# Patient Record
Sex: Male | Born: 1970
Health system: Southern US, Community
[De-identification: ages and names within clinical notes are randomized; demographics above are authoritative.]

## PROBLEM LIST (undated history)

## (undated) DIAGNOSIS — I10 Essential (primary) hypertension: Secondary | ICD-10-CM

## (undated) DIAGNOSIS — K649 Unspecified hemorrhoids: Secondary | ICD-10-CM

## (undated) DIAGNOSIS — T7840XA Allergy, unspecified, initial encounter: Secondary | ICD-10-CM

## (undated) DIAGNOSIS — R040 Epistaxis: Secondary | ICD-10-CM

## (undated) HISTORY — DX: Unspecified hemorrhoids: K64.9

## (undated) HISTORY — DX: Essential (primary) hypertension: I10

## (undated) HISTORY — DX: Allergy, unspecified, initial encounter: T78.40XA

## (undated) HISTORY — DX: Epistaxis: R04.0

---

## 2007-07-14 HISTORY — PX: HERNIA REPAIR: SHX51

## 2013-06-16 ENCOUNTER — Ambulatory Visit (INDEPENDENT_AMBULATORY_CARE_PROVIDER_SITE_OTHER): Payer: BC Managed Care – PPO | Admitting: Family Medicine

## 2013-06-16 VITALS — BP 120/76 | HR 75 | Temp 98.3°F | Resp 16 | Ht 67.5 in | Wt 166.0 lb

## 2013-06-16 DIAGNOSIS — K4090 Unilateral inguinal hernia, without obstruction or gangrene, not specified as recurrent: Secondary | ICD-10-CM

## 2013-06-16 DIAGNOSIS — K644 Residual hemorrhoidal skin tags: Secondary | ICD-10-CM

## 2013-06-16 DIAGNOSIS — K648 Other hemorrhoids: Secondary | ICD-10-CM

## 2013-06-16 DIAGNOSIS — H1045 Other chronic allergic conjunctivitis: Secondary | ICD-10-CM

## 2013-06-16 DIAGNOSIS — R040 Epistaxis: Secondary | ICD-10-CM

## 2013-06-16 DIAGNOSIS — Z0000 Encounter for general adult medical examination without abnormal findings: Secondary | ICD-10-CM

## 2013-06-16 DIAGNOSIS — H1013 Acute atopic conjunctivitis, bilateral: Secondary | ICD-10-CM

## 2013-06-16 LAB — POCT CBC
HCT, POC: 52.5 % (ref 43.5–53.7)
Hemoglobin: 16.9 g/dL (ref 14.1–18.1)
Lymph, poc: 1.7 (ref 0.6–3.4)
MCH, POC: 27 pg (ref 27–31.2)
MCV: 83.8 fL (ref 80–97)
MPV: 13.1 fL (ref 0–99.8)
POC MID %: 8.1 %M (ref 0–12)
Platelet Count, POC: 159 10*3/uL (ref 142–424)
RBC: 6.27 M/uL — AB (ref 4.69–6.13)
WBC: 6.1 10*3/uL (ref 4.6–10.2)

## 2013-06-16 LAB — COMPREHENSIVE METABOLIC PANEL
AST: 19 U/L (ref 0–37)
Albumin: 4.2 g/dL (ref 3.5–5.2)
Alkaline Phosphatase: 109 U/L (ref 39–117)
Chloride: 105 mEq/L (ref 96–112)
Glucose, Bld: 94 mg/dL (ref 70–99)
Potassium: 4 mEq/L (ref 3.5–5.3)
Sodium: 137 mEq/L (ref 135–145)
Total Protein: 7.4 g/dL (ref 6.0–8.3)

## 2013-06-16 LAB — POCT GLYCOSYLATED HEMOGLOBIN (HGB A1C): Hemoglobin A1C: 5.3

## 2013-06-16 LAB — LIPID PANEL
LDL Cholesterol: 113 mg/dL — ABNORMAL HIGH (ref 0–99)
Total CHOL/HDL Ratio: 5.4 Ratio
Triglycerides: 108 mg/dL (ref <150)

## 2013-06-16 MED ORDER — HYDROCORTISONE ACE-PRAMOXINE 2.5-1 % RE CREA: 1.0000 "application " | TOPICAL_CREAM | Freq: Three times a day (TID) | RECTAL | Status: DC

## 2013-06-16 NOTE — Progress Notes (Signed)
Physical examination:  History: Patient is here for his annual physical examination. He is generally in good health. He is from the [place] in [place]. He generally is healthy though he has some problems with snoring, hemorrhoids, nosebleeds.  Past history: Medications: None Allergies: None Medical illnesses: None Surgical: Left inguinal hernia 11/24/07  Family history: Father died of heart disease. Mother is living and well. Brother has diabetes. Sister living and well.  Social history: Patient uses snuff. Does not use alcohol or drugs. He is married, his wife is in [place], he has one child. He has gotten his [place] citizenship and hopes to bring his wife over here soon. He works as a Naval architect person in Engineer, water type role. He does not have to do a lot of manual work. He has college education.  Review of systems: Constitutional: Unremarkable HEENT: Unremarkable except for epistaxis from the left nares usually in the winter. Also he has summer time allergies Respiratory: Unremarkable Cardiovascular: Unremarkable Gastrointestinal: Has had hemorrhoids. They have bled in the past. There still problem. Endocrine: Normal colon Genitourinary: Unremarkable A skeletal: Unremarkable Allergies: Has seasonal allergies Neurologic: Unremarkable Hematologic: Psychiatric: Unremarkable  Physical examination: Well-developed well-nourished man in no acute distress. TMs normal. Eyes PERRLA. Fundi benign. Throat clear. He has very prominent vessels on the septum on the left side of his nose, which is where he bleeds from I'm sure. His throat is clear. Neck supple without nodes or thyromegaly. Chest clear to auscultation. Heart regular without murmurs gallops or arrhythmias. Has a prominent S2. Abdomen soft without mass or tenderness. Normal male external genitalia. Has a herniorrhaphy scar of the left. He does have a palpable right inguinal hernia. It is not bulging but definitely bulges with coughing. His  extremities are unremarkable. Skin unremarkable.  Assessment: Complete physical Hemorrhoids Epistaxis Allergic conjunctivitis Right inguinal hernia  Plan: Check labs Return when necessary  Results for orders placed in visit on 06/16/13  POCT CBC      Result Value Range   WBC 6.1  4.6 - 10.2 K/uL   Lymph, poc 1.7  0.6 - 3.4   POC LYMPH PERCENT 28.2  10 - 50 %L   MID (cbc) 0.5  0 - 0.9   POC MID % 8.1  0 - 12 %M   POC Granulocyte 3.9  2 - 6.9   Granulocyte percent 63.7  37 - 80 %G   RBC 6.27 (*) 4.69 - 6.13 M/uL   Hemoglobin 16.9  14.1 - 18.1 g/dL   HCT, POC 21.3  08.6 - 53.7 %   MCV 83.8  80 - 97 fL   MCH, POC 27.0  27 - 31.2 pg   MCHC 32.2  31.8 - 35.4 g/dL   RDW, POC 57.8     Platelet Count, POC 159  142 - 424 K/uL   MPV 13.1  0 - 99.8 fL  IFOBT (OCCULT BLOOD)      Result Value Range   IFOBT Negative    POCT GLYCOSYLATED HEMOGLOBIN (HGB A1C)      Result Value Range   Hemoglobin A1C 5.3

## 2013-06-16 NOTE — Patient Instructions (Addendum)
Take claritin (loratidine) or allegra (fexofenadine) one daily when you have problems with eye itching.  If that does not work try some zyrtec eye drops.  Use Analpram cream twice daily for hemorrhoids  Use a little Polysporin (non-prescription) ointment in your nose several times a day when you have problems with nosebleeds.  If your nose is bleeding, squeeze it tightly like we talked about for about 5 minutes.  We will contact you about the results of your labs  After you get back from [place], you might consider contacting me to make a referral for you to see a surgeon for the other hernia.  Return if further problems

## 2013-06-17 ENCOUNTER — Encounter: Payer: Self-pay | Admitting: Family Medicine

## 2014-09-30 ENCOUNTER — Ambulatory Visit (INDEPENDENT_AMBULATORY_CARE_PROVIDER_SITE_OTHER): Payer: BLUE CROSS/BLUE SHIELD | Admitting: Family Medicine

## 2014-09-30 VITALS — BP 160/100 | HR 104 | Temp 100.5°F | Resp 16 | Ht 69.5 in | Wt 168.0 lb

## 2014-09-30 DIAGNOSIS — R509 Fever, unspecified: Secondary | ICD-10-CM | POA: Diagnosis not present

## 2014-09-30 DIAGNOSIS — R6889 Other general symptoms and signs: Secondary | ICD-10-CM

## 2014-09-30 DIAGNOSIS — R1084 Generalized abdominal pain: Secondary | ICD-10-CM

## 2014-09-30 DIAGNOSIS — J101 Influenza due to other identified influenza virus with other respiratory manifestations: Secondary | ICD-10-CM | POA: Diagnosis not present

## 2014-09-30 LAB — COMPLETE METABOLIC PANEL WITH GFR
ALT: 17 U/L (ref 0–53)
AST: 27 U/L (ref 0–37)
Albumin: 3.7 g/dL (ref 3.5–5.2)
Alkaline Phosphatase: 99 U/L (ref 39–117)
CO2: 23 mEq/L (ref 19–32)
Calcium: 8.7 mg/dL (ref 8.4–10.5)
Chloride: 101 mEq/L (ref 96–112)
Creat: 0.92 mg/dL (ref 0.50–1.35)
GFR, Est African American: >89 mL/min
GFR, Est Non African American: >89 mL/min
Potassium: 3.7 mEq/L (ref 3.5–5.3)
Sodium: 134 mEq/L — ABNORMAL LOW (ref 135–145)
Total Bilirubin: 0.8 mg/dL (ref 0.2–1.2)

## 2014-09-30 LAB — COMPLETE METABOLIC PANEL WITHOUT GFR
BUN: 12 mg/dL (ref 6–23)
Glucose, Bld: 192 mg/dL — ABNORMAL HIGH (ref 70–99)
Total Protein: 6.9 g/dL (ref 6.0–8.3)

## 2014-09-30 LAB — POCT CBC
Granulocyte percent: 78.4 %G (ref 37–80)
HCT, POC: 51.6 % (ref 43.5–53.7)
Hemoglobin: 16.8 g/dL (ref 14.1–18.1)
Lymph, poc: 1.7 (ref 0.6–3.4)
MCH, POC: 25.3 pg — AB (ref 27–31.2)
MCHC: 32.6 g/dL (ref 31.8–35.4)
MCV: 77.7 fL — AB (ref 80–97)
MID (cbc): 0.3 (ref 0–0.9)
MPV: 8.7 fL (ref 0–99.8)
POC Granulocyte: 7 — AB (ref 2–6.9)
POC LYMPH PERCENT: 18.7 %L (ref 10–50)
POC MID %: 2.9 % (ref 0–12)
Platelet Count, POC: 119 10*3/uL — AB (ref 142–424)
RBC: 6.64 M/uL — AB (ref 4.69–6.13)
RDW, POC: 14.1 %
WBC: 8.9 10*3/uL (ref 4.6–10.2)

## 2014-09-30 LAB — POCT INFLUENZA A/B
Influenza A, POC: POSITIVE
Influenza B, POC: NEGATIVE

## 2014-09-30 MED ORDER — IBUPROFEN 200 MG PO TABS
600.0000 mg | ORAL_TABLET | Freq: Once | ORAL | Status: AC
  Administered 2014-09-30: 600 mg via ORAL

## 2014-09-30 MED ORDER — OSELTAMIVIR PHOSPHATE 75 MG PO CAPS: 75.0000 mg | ORAL_CAPSULE | Freq: Two times a day (BID) | ORAL | Status: DC

## 2014-09-30 NOTE — Progress Notes (Signed)
Chief Complaint:  Chief Complaint  Patient presents with  . Flu like symptoms    x 2 days  . Fever    HPI: Eric Pham is a 44 y.o. male who is here for 2 day history of flulike symptoms. This started on Friday evening. He has sinus pressure, muscle aches all over, fever. He has had sick contacts at work. He denies any ear pain, sore throat. He has a child but he is living and the [place]. Did not get flu vaccine this year. Had some loose stools, 3 episodes yesterday. There was some abdominal cramping all over. Denies nausea Or vomiting. Denies any rashes. He denies any chest pain, shortness of breath, palpitations. He has tried over-the-counter medications for this without any relief.  History reviewed. No pertinent past medical history. History reviewed. No pertinent past surgical history. History   Social History  . Marital Status: Married    Spouse Name: N/A  . Number of Children: N/A  . Years of Education: N/A   Social History Main Topics  . Smoking status: Never Smoker   . Smokeless tobacco: Current User  . Alcohol Use: Not on file  . Drug Use: Not on file  . Sexual Activity: Not on file   Other Topics Concern  . None   Social History Narrative   Family History  Problem Relation Age of Onset  . Diabetes Sister   . Diabetes Brother    No Known Allergies Prior to Admission medications   Medication Sig Start Date End Date Taking? Authorizing Provider  hydrocortisone-pramoxine [facility](ANALPRAM-HC) 2.5-1 % rectal cream Place 1 application rectally 3 (three) times daily. 06/16/13  Yes Peyton Najjaravid H Hopper, MD     ROS: The patient denies  night sweats, unintentional weight loss, chest pain, palpitations, wheezing, dyspnea on exertion, nausea, vomiting, abdominal pain, dysuria, hematuria, melena, numbness,  or tingling.  All other systems have been reviewed and were otherwise negative with the exception of those mentioned in the HPI and as above.    PHYSICAL EXAM: Filed  Vitals:   09/30/14 0837  BP: 160/100  Pulse: 104  Temp: 100.5 F (38.1 C)  Resp: 16   SpO2 Readings from Last 3 Encounters:  09/30/14 98%  06/16/13 97%    Filed Vitals:   09/30/14 0837  Height: 5' 9.5" (1.765 m)  Weight: 168 lb (76.204 kg)   Body mass index is 24.46 kg/(m^2).  General: Alert, no acute distress HEENT:  Normocephalic, atraumatic, oropharynx patent. EOMI, PERRLA Erythematous throat, no exudates, TM normal, neg sinus tenderness, + erythematous/boggy nasal mucosa Cardiovascular:  Regular rate and rhythm, no rubs murmurs or gallops.  No Carotid bruits, radial pulse intact. No pedal edema.  Respiratory: Clear to auscultation bilaterally.  No wheezes, rales, or rhonchi.  No cyanosis, no use of accessory musculature GI: No organomegaly, abdomen is soft and non-tender, positive bowel sounds.  No masses. Skin: No rashes. Neurologic: Facial musculature symmetric. Psychiatric: Patient is appropriate throughout our interaction. Lymphatic: No cervical lymphadenopathy Musculoskeletal: Gait intact.   LABS: Results for orders placed or performed in visit on 09/30/14  COMPLETE METABOLIC PANEL WITH GFR  Result Value Ref Range   Sodium 134 (L) 135 - 145 mEq/L   Potassium 3.7 3.5 - 5.3 mEq/L   Chloride 101 96 - 112 mEq/L   CO2 23 19 - 32 mEq/L   Glucose, Bld 192 (H) 70 - 99 mg/dL   BUN 12 6 - 23 mg/dL   Creat 1.610.92 0.960.50 -  1.35 mg/dL   Total Bilirubin 0.8 0.2 - 1.2 mg/dL   Alkaline Phosphatase 99 39 - 117 U/L   AST 27 0 - 37 U/L   ALT 17 0 - 53 U/L   Total Protein 6.9 6.0 - 8.3 g/dL   Albumin 3.7 3.5 - 5.2 g/dL   Calcium 8.7 8.4 - 21.3 mg/dL   GFR, Est African American >89 mL/min   GFR, Est Non African American >89 mL/min  POCT Influenza A/B  Result Value Ref Range   Influenza A, POC Positive    Influenza B, POC Negative   POCT CBC  Result Value Ref Range   WBC 8.9 4.6 - 10.2 K/uL   Lymph, poc 1.7 0.6 - 3.4   POC LYMPH PERCENT 18.7 10 - 50 %L   MID (cbc) 0.3 0 -  0.9   POC MID % 2.9 0 - 12 %M   POC Granulocyte 7.0 (A) 2 - 6.9   Granulocyte percent 78.4 37 - 80 %G   RBC 6.64 (A) 4.69 - 6.13 M/uL   Hemoglobin 16.8 14.1 - 18.1 g/dL   HCT, POC 08.6 57.8 - 53.7 %   MCV 77.7 (A) 80 - 97 fL   MCH, POC 25.3 (A) 27 - 31.2 pg   MCHC 32.6 31.8 - 35.4 g/dL   RDW, POC 46.9 %   Platelet Count, POC 119 (A) 142 - 424 K/uL   MPV 8.7 0 - 99.8 fL     EKG/XRAY:   Primary read interpreted by Dr. Conley Rolls at [facility].   ASSESSMENT/PLAN: Encounter Diagnoses  Name Primary?  . Flu-like symptoms   . Generalized abdominal pain   . Other specified fever   . Influenza A Yes   Pleasant 44 year old [place] gentleman with influenza A. He has not traveled anywhere. He denies ever having TB. He will be prescribed Tamiflu twice a day 5 days. Fluids, take Tylenol or ibuprofen as needed. Nasacort prn Fu as Needed.  Gross sideeffects, risk and benefits, and alternatives of medications d/w patient. Patient is aware that all medications have potential sideeffects and we are unable to predict every sideeffect or drug-drug interaction that may occur.  Hamilton Capri PHUONG, DO 10/01/2014 6:29 PM

## 2014-09-30 NOTE — Patient Instructions (Signed)

## 2014-10-05 ENCOUNTER — Ambulatory Visit (INDEPENDENT_AMBULATORY_CARE_PROVIDER_SITE_OTHER): Payer: BLUE CROSS/BLUE SHIELD | Admitting: Family Medicine

## 2014-10-05 VITALS — BP 128/88 | HR 79 | Temp 97.9°F | Resp 16 | Ht 68.0 in | Wt 169.6 lb

## 2014-10-05 DIAGNOSIS — J209 Acute bronchitis, unspecified: Secondary | ICD-10-CM | POA: Diagnosis not present

## 2014-10-05 DIAGNOSIS — R6889 Other general symptoms and signs: Secondary | ICD-10-CM

## 2014-10-05 MED ORDER — HYDROCODONE-HOMATROPINE 5-1.5 MG/5ML PO SYRP: 5.0000 mL | ORAL_SOLUTION | ORAL | Status: DC | PRN

## 2014-10-05 MED ORDER — AMOXICILLIN-POT CLAVULANATE 875-125 MG PO TABS: 1.0000 | ORAL_TABLET | Freq: Two times a day (BID) | ORAL | Status: DC

## 2014-10-05 MED ORDER — BENZONATATE 100 MG PO CAPS: 100.0000 mg | ORAL_CAPSULE | Freq: Three times a day (TID) | ORAL | Status: DC | PRN

## 2014-10-05 NOTE — Progress Notes (Signed)
Subjective: 44 year old man who is been sick all week with a flulike illness. He has had a fever cough and congestion. He smokes. He is coughed up some blood and blowing out some bloody mucus now. His fever is gone now but he still feels sick. He has not been to work this week.  Objective: Coughing a lot, still somewhat ill-appearing. His TMs are normal. Nose congested. Throat clear. Neck supple without nodes. Sinuses nontender. Chest has a few  rhonchi and soft wheezing. Heart regular  Assessment: Flulike illness Acute bronchitis Tobacco use disorder  Plan: Encourage him to quit smoking Augmentin because he has a purulent cough with some blood in it  Return if not improving See discharge instructions

## 2014-10-05 NOTE — Patient Instructions (Signed)
Drink plenty of fluids and get enough rest  I think you will be well to go back to work Monday. The cough however will probably continue for a well.  Cough syrup, Hycodan, for nighttime use or when you're not working. Take 1 teaspoon every 4 hours when necessary  Cough pills for use when you do go back to work. They will not make you sleepy.  Take the antibiotic, Augmentin 875 (amoxicillin/clavulanate) one pill twice daily at breakfast and supper  Take an over-the-counter Claritin-D (loratadine D) or Zyrtec-D (cetirizine D) if needed for head congestion  Return if getting worse rather than better.

## 2014-10-13 ENCOUNTER — Encounter: Payer: Self-pay | Admitting: Family Medicine

## 2015-02-08 ENCOUNTER — Ambulatory Visit (INDEPENDENT_AMBULATORY_CARE_PROVIDER_SITE_OTHER): Payer: BLUE CROSS/BLUE SHIELD | Admitting: Emergency Medicine

## 2015-02-08 VITALS — BP 156/100 | HR 72 | Temp 98.3°F | Resp 16 | Ht 68.0 in | Wt 169.0 lb

## 2015-02-08 DIAGNOSIS — I10 Essential (primary) hypertension: Secondary | ICD-10-CM | POA: Diagnosis not present

## 2015-02-08 LAB — POCT URINALYSIS DIPSTICK
Bilirubin, UA: NEGATIVE
Glucose, UA: NEGATIVE
KETONES UA: NEGATIVE
Leukocytes, UA: NEGATIVE
Nitrite, UA: NEGATIVE
PH UA: 5
Protein, UA: NEGATIVE
Spec Grav, UA: 1.025
UROBILINOGEN UA: 0.2

## 2015-02-08 MED ORDER — LOSARTAN POTASSIUM 50 MG PO TABS: 50.0000 mg | ORAL_TABLET | Freq: Every day | ORAL | Status: DC

## 2015-02-08 NOTE — Patient Instructions (Signed)

## 2015-02-08 NOTE — Progress Notes (Signed)
Subjective:  Patient ID: Eric Pham, male    DOB: [birth year]  Age: 44 y.o. MRN: [id number]  CC: Hypertension   HPI Eric Pham presents  with hypertension. Is apparently new onset problem. He went to see his eye doctor relating an eye complaint and was found to have a blood pressure 180/110. Since come here asymptomatic for evaluation. He has no chest pain tightness heaviness pressure shortness of breath or peripheral edema. Has no history kidney disease. He has no history of hypertension. Patient is blood sugars found in March 2016 to be 192 and there is no follow-up on that. He is nonsmoker  History [place] has no past medical history on file.   He has no past surgical history on file.   His  family history includes Diabetes in his brother and sister.  He   reports that he has never smoked. He uses smokeless tobacco. He reports that he does not drink alcohol or use illicit drugs.  Outpatient Prescriptions Prior to Visit  Medication Sig Dispense Refill  . amoxicillin-clavulanate (AUGMENTIN) 875-125 MG per tablet Take 1 tablet by mouth 2 (two) times daily. (Patient not taking: Reported on 02/08/2015) 20 tablet 0  . benzonatate (TESSALON) 100 MG capsule Take 1-2 capsules (100-200 mg total) by mouth 3 (three) times daily as needed. (Patient not taking: Reported on 02/08/2015) 30 capsule 0  . HYDROcodone-homatropine (HYCODAN) 5-1.5 MG/5ML syrup Take 5 mLs by mouth every 4 (four) hours as needed. (Patient not taking: Reported on 02/08/2015) 120 mL 0  . hydrocortisone-pramoxine (ANALPRAM-HC) 2.5-1 % rectal cream Place 1 application rectally 3 (three) times daily. (Patient not taking: Reported on 10/05/2014) 30 g 2  . oseltamivir (TAMIFLU) 75 MG capsule Take 1 capsule (75 mg total) by mouth 2 (two) times daily. (Patient not taking: Reported on 10/05/2014) 10 capsule 0   No facility-administered medications prior to visit.    History   Social History  . Marital Status: Married   Spouse Name: N/A  . Number of Children: N/A  . Years of Education: N/A   Social History Main Topics  . Smoking status: Never Smoker   . Smokeless tobacco: Current User  . Alcohol Use: No  . Drug Use: No  . Sexual Activity: Not on file   Other Topics Concern  . None   Social History Narrative     Review of Systems  Constitutional: Negative for fever, chills and appetite change.  HENT: Negative for congestion, ear pain, postnasal drip, sinus pressure and sore throat.   Eyes: Negative for pain and redness.  Respiratory: Negative for cough, shortness of breath and wheezing.   Cardiovascular: Negative for leg swelling.  Gastrointestinal: Negative for nausea, vomiting, abdominal pain, diarrhea, constipation and blood in stool.  Endocrine: Negative for polyuria.  Genitourinary: Negative for dysuria, urgency, frequency and flank pain.  Musculoskeletal: Negative for gait problem.  Skin: Negative for rash.  Neurological: Negative for weakness and headaches.  Psychiatric/Behavioral: Negative for confusion and decreased concentration. The patient is not nervous/anxious.     Objective:  BP 156/100 mmHg  Pulse 72  Temp(Src) 98.3 F (36.8 C) (Oral)  Resp 16  Ht 5\' 8"  (1.727 m)  Wt 169 lb (76.658 kg)  BMI 25.70 kg/m2  SpO2 99%  Physical Exam  Constitutional: He is oriented to person, place, and time. He appears well-developed and well-nourished. No distress.  HENT:  Head: Normocephalic and atraumatic.  Right Ear: External ear normal.  Left Ear: External ear normal.  Nose: Nose normal.  Eyes: Conjunctivae and EOM are normal. Pupils are equal, round, and reactive to light. No scleral icterus.  Neck: Normal range of motion. Neck supple. No tracheal deviation present.  Cardiovascular: Normal rate, regular rhythm and normal heart sounds.   Pulmonary/Chest: Effort normal. No respiratory distress. He has no wheezes. He has no rales.  Abdominal: He exhibits no mass. There is no  tenderness. There is no rebound and no guarding.  Musculoskeletal: He exhibits no edema.  Lymphadenopathy:    He has no cervical adenopathy.  Neurological: He is alert and oriented to person, place, and time. Coordination normal.  Skin: Skin is warm and dry. No rash noted.  Psychiatric: He has a normal mood and affect. His behavior is normal.      Assessment & Plan:   Eric Pham was seen today for hypertension.  Diagnoses and all orders for this visit:  Essential hypertension, benign Orders: -     CBC -     Comprehensive metabolic panel -     TSH -     Lipid panel -     POCT urinalysis dipstick  Other orders -     losartan (COZAAR) 50 MG tablet; Take 1 tablet (50 mg total) by mouth daily.   I am having Eric Pham start on losartan. I am also having him maintain his hydrocortisone-pramoxine, oseltamivir, HYDROcodone-homatropine, benzonatate, and amoxicillin-clavulanate.  Meds ordered this encounter  Medications  . losartan (COZAAR) 50 MG tablet    Sig: Take 1 tablet (50 mg total) by mouth daily.    Dispense:  90 tablet    Refill:  0    Appropriate red flag conditions were discussed with the patient as well as actions that should be taken.  Patient expressed his understanding.  Follow-up: Return in about 1 month (around 03/11/2015).  Carmelina Dane, MD

## 2015-02-09 LAB — CBC
HCT: 49.1 % (ref 39.0–52.0)
Hemoglobin: 16.9 g/dL (ref 13.0–17.0)
MCH: 26.3 pg (ref 26.0–34.0)
MCHC: 34.4 g/dL (ref 30.0–36.0)
MCV: 76.5 fL — AB (ref 78.0–100.0)
Platelets: 146 10*3/uL — ABNORMAL LOW (ref 150–400)
RBC: 6.42 MIL/uL — AB (ref 4.22–5.81)
RDW: 14.7 % (ref 11.5–15.5)
WBC: 6.1 10*3/uL (ref 4.0–10.5)

## 2015-02-09 LAB — COMPREHENSIVE METABOLIC PANEL
ALT: 17 U/L (ref 9–46)
AST: 21 U/L (ref 10–40)
Albumin: 4 g/dL (ref 3.6–5.1)
Alkaline Phosphatase: 113 U/L (ref 40–115)
BUN: 14 mg/dL (ref 7–25)
CHLORIDE: 105 mmol/L (ref 98–110)
CO2: 24 mmol/L (ref 20–31)
Calcium: 9.3 mg/dL (ref 8.6–10.3)
Creat: 0.79 mg/dL (ref 0.60–1.35)
Glucose, Bld: 105 mg/dL — ABNORMAL HIGH (ref 65–99)
Potassium: 4.4 mmol/L (ref 3.5–5.3)
Sodium: 141 mmol/L (ref 135–146)
Total Bilirubin: 0.7 mg/dL (ref 0.2–1.2)
Total Protein: 7.1 g/dL (ref 6.1–8.1)

## 2015-02-09 LAB — LIPID PANEL
CHOL/HDL RATIO: 6.6 ratio — AB (ref <=5.0)
Cholesterol: 158 mg/dL (ref 125–200)
HDL: 24 mg/dL — ABNORMAL LOW (ref >=40)
LDL CALC: 99 mg/dL (ref <130)
Triglycerides: 174 mg/dL — ABNORMAL HIGH (ref <150)
VLDL: 35 mg/dL — ABNORMAL HIGH (ref <30)

## 2015-02-09 LAB — TSH: TSH: 2.023 u[IU]/mL (ref 0.350–4.500)

## 2015-03-08 ENCOUNTER — Ambulatory Visit (INDEPENDENT_AMBULATORY_CARE_PROVIDER_SITE_OTHER): Payer: BLUE CROSS/BLUE SHIELD | Admitting: Physician Assistant

## 2015-03-08 VITALS — BP 124/88 | HR 72 | Temp 98.3°F | Resp 16 | Ht 68.0 in | Wt 167.6 lb

## 2015-03-08 DIAGNOSIS — I10 Essential (primary) hypertension: Secondary | ICD-10-CM

## 2015-03-08 DIAGNOSIS — E786 Lipoprotein deficiency: Secondary | ICD-10-CM

## 2015-03-08 MED ORDER — LOSARTAN POTASSIUM 50 MG PO TABS: 50.0000 mg | ORAL_TABLET | Freq: Every day | ORAL | Status: DC

## 2015-03-08 NOTE — Patient Instructions (Signed)
I've refilled your losartan for one year.  Please continue to check your blood pressure at home and record the numbers. If you are consistently running above 140/90 please let us know and we'll likely add a 2nd low dose medication. Keep up the good work with the exercise.   Hypertension Hypertension, commonly called high blood pressure, is when the force of blood pumping through your arteries is too strong. Your arteries are the blood vessels that carry blood from your heart throughout your body. A blood pressure reading consists of a higher number over a lower number, such as 110/72. The higher number (systolic) is the pressure inside your arteries when your heart pumps. The lower number (diastolic) is the pressure inside your arteries when your heart relaxes. Ideally you want your blood pressure below 120/80. Hypertension forces your heart to work harder to pump blood. Your arteries may become narrow or stiff. Having hypertension puts you at risk for heart disease, stroke, and other problems.  RISK FACTORS Some risk factors for high blood pressure are controllable. Others are not.  Risk factors you cannot control include:   Race. You may be at higher risk if you are African American.  Age. Risk increases with age.  Gender. Men are at higher risk than women before age 48 years. After age 61, women are at higher risk than men. Risk factors you can control include:  Not getting enough exercise or physical activity.  Being overweight.  Getting too much fat, sugar, calories, or salt in your diet.  Drinking too much alcohol. SIGNS AND SYMPTOMS Hypertension does not usually cause signs or symptoms. Extremely high blood pressure (hypertensive crisis) may cause headache, anxiety, shortness of breath, and nosebleed. DIAGNOSIS  To check if you have hypertension, your health care provider will measure your blood pressure while you are seated, with your arm held at the level of your heart. It  should be measured at least twice using the same arm. Certain conditions can cause a difference in blood pressure between your right and left arms. A blood pressure reading that is higher than normal on one occasion does not mean that you need treatment. If one blood pressure reading is high, ask your health care provider about having it checked again. TREATMENT  Treating high blood pressure includes making lifestyle changes and possibly taking medicine. Living a healthy lifestyle can help lower high blood pressure. You may need to change some of your habits. Lifestyle changes may include:  Following the DASH diet. This diet is high in fruits, vegetables, and whole grains. It is low in salt, red meat, and added sugars.  Getting at least 2 hours of brisk physical activity every week.  Losing weight if necessary.  Not smoking.  Limiting alcoholic beverages.  Learning ways to reduce stress. If lifestyle changes are not enough to get your blood pressure under control, your health care provider may prescribe medicine. You may need to take more than one. Work closely with your health care provider to understand the risks and benefits. HOME CARE INSTRUCTIONS  Have your blood pressure rechecked as directed by your health care provider.   Take medicines only as directed by your health care provider. Follow the directions carefully. Blood pressure medicines must be taken as prescribed. The medicine does not work as well when you skip doses. Skipping doses also puts you at risk for problems.   Do not smoke.   Monitor your blood pressure at home as directed by your health care provider.  SEEK MEDICAL CARE IF:   You think you are having a reaction to medicines taken.  You have recurrent headaches or feel dizzy.  You have swelling in your ankles.  You have trouble with your vision. SEEK IMMEDIATE MEDICAL CARE IF:  You develop a severe headache or confusion.  You have unusual weakness,  numbness, or feel faint.  You have severe chest or abdominal pain.  You vomit repeatedly.  You have trouble breathing. MAKE SURE YOU:   Understand these instructions.  Will watch your condition.  Will get help right away if you are not doing well or get worse. Document Released: 06/29/2005 Document Revised: 11/13/2013 Document Reviewed: 04/21/2013 [facility] Patient Information 2015 [place], [place]. This information is not intended to replace advice given to you by your health care provider. Make sure you discuss any questions you have with your health care provider.

## 2015-03-08 NOTE — Progress Notes (Signed)
   Subjective:    Patient ID: Eric Pham, male    DOB: 11/03/1970, 44 y.o.   MRN: [id number]  Chief Complaint  Patient presents with  . Hypertension    recheck bp   Medications, allergies, past medical history, surgical history, family history, social history and problem list reviewed and updated.  HPI  44 yom returns for bp check.   Was seen here last month and started on losartan 50 for htn for recent elevated bp readings. Has been taking daily and tolerating well. He is checking bp daily at home. Running between 120-140/80-90s. Denies cp, sob, vision changes, dizziness. CMP normal one month ago.   Exercises 45 mins daily with walking. Watches salt and does not add to his meals.   Review of Systems See HPI     Objective:   Physical Exam  Constitutional: He is oriented to person, place, and time.  BP 124/88 mmHg  Pulse 72  Temp(Src) 98.3 F (36.8 C) (Oral)  Resp 16  Ht  (1.727 m)  Wt 167 lb 9.6 oz (76.023 kg)  BMI 25.49 kg/m2  SpO2 98%   Cardiovascular: Normal rate, regular rhythm and normal heart sounds.   Neurological: He is alert and oriented to person, place, and time.      Assessment & Plan:   Essential hypertension, benign - Plan: losartan (COZAAR) 50 MG tablet  Essential hypertension  Low HDL (under 40) --continue current dose, pt instructed to continue to record and call us if consistently running >140/90 --keep up exercise routine for htn and low hdl  Donnajean Lopes, PA-C Physician Assistant-Certified [facility]  03/08/2015 10:03 AM

## 2015-05-29 LAB — PSA: PSA: 0.4

## 2015-07-08 ENCOUNTER — Ambulatory Visit (INDEPENDENT_AMBULATORY_CARE_PROVIDER_SITE_OTHER): Payer: BLUE CROSS/BLUE SHIELD | Admitting: Physician Assistant

## 2015-07-08 VITALS — BP 158/98 | HR 69 | Temp 97.7°F | Resp 16 | Ht 67.25 in | Wt 175.0 lb

## 2015-07-08 DIAGNOSIS — Z23 Encounter for immunization: Secondary | ICD-10-CM

## 2015-07-08 DIAGNOSIS — I10 Essential (primary) hypertension: Secondary | ICD-10-CM | POA: Diagnosis not present

## 2015-07-08 DIAGNOSIS — K4090 Unilateral inguinal hernia, without obstruction or gangrene, not specified as recurrent: Secondary | ICD-10-CM | POA: Diagnosis not present

## 2015-07-08 DIAGNOSIS — R040 Epistaxis: Secondary | ICD-10-CM | POA: Insufficient documentation

## 2015-07-08 DIAGNOSIS — Z0000 Encounter for general adult medical examination without abnormal findings: Secondary | ICD-10-CM | POA: Diagnosis not present

## 2015-07-08 DIAGNOSIS — Z6827 Body mass index (BMI) 27.0-27.9, adult: Secondary | ICD-10-CM

## 2015-07-08 DIAGNOSIS — K649 Unspecified hemorrhoids: Secondary | ICD-10-CM | POA: Insufficient documentation

## 2015-07-08 NOTE — Patient Instructions (Signed)

## 2015-07-08 NOTE — Progress Notes (Signed)
Patient ID: Eric Pham, male    DOB: [birth year], 44 y.o.   MRN: [id number]  PCP: No primary care provider on file.  Chief Complaint  Patient presents with  . Annual Exam    Subjective:   HPI: Presents today for annual exam.  His only concern today is the blood pressure. He has not yet taken his dose of losartan this morning, and is not fasting. Home readings are 140's/90's.  Colorectal Cancer Screening: not yet Prostate Cancer Screening: done last month Bone Density Testing: not yet HIV Screening: completed 209 STI Screening: low risk Seasonal Influenza Vaccination: completed elsewhere Td/Tdap Vaccination: unknown. Will do today. Pneumococcal Vaccination: not a candidate Zoster Vaccination: not a candidate Frequency of Dental evaluation: Q6 months Frequency of Eye evaluation: annually    Patient Active Problem List   Diagnosis Date Noted  . Epistaxis 07/08/2015  . Hemorrhoids 07/08/2015  . Inguinal hernia 07/08/2015  . Essential hypertension 03/08/2015  . Low HDL (under 40) 03/08/2015    Past Medical History  Diagnosis Date  . Hypertension   . Epistaxis   . Hemorrhoids   . Allergy      Prior to Admission medications   Medication Sig Start Date End Date Taking? Authorizing Provider  losartan (COZAAR) 50 MG tablet Take 1 tablet (50 mg total) by mouth daily. 03/08/15  Yes Raelyn Ensignodd McVeigh, PA    No Known Allergies  Past Surgical History  Procedure Laterality Date  . Hernia repair Left 2009    inguinal    Family History  Problem Relation Age of Onset  . Diabetes Sister   . Diabetes Brother   . Hypertension Brother   . Hypertension Sister     Social History   Social History  . Marital Status: Married    Spouse Name: Ranya  . Number of Children: 1  . Years of Education: High Sch[employer]   Occupational History  . Material [employer]   Social History Main Topics  . Smoking status: Current Some Day Smoker  . Smokeless tobacco: Former  NeurosurgeonUser  . Alcohol Use: No  . Drug Use: No  . Sexual Activity:    Partners: Female   Other Topics Concern  . None   Social History Narrative   From [place]. [place]ame [place] the [place] in [place].   Lives with 3 friends.   His wife and son live in [place].       Review of Systems  Constitutional: Negative.   HENT: Negative.   Eyes: Negative.   Respiratory: Negative.   Cardiovascular: Negative.   Gastrointestinal: Negative.   Genitourinary: Negative.   Musculoskeletal: Negative.   Skin: Negative.   Neurological: Negative.   Psychiatric/Behavioral: Negative.         Objective:  Physical Exam  Constitutional: He is oriented to person, place, and time. He appears well-developed and well-nourished. He is active and cooperative.  Non-toxic appearance. He does not have a sickly appearance. He does not appear ill. No distress.  BP 158/98 mmHg  Pulse 69  Temp(Src) 97.7 F (36.5 C) (Oral)  Resp 16  Ht 5' 7.25" (1.708 m)  Wt 175 lb (79.379 kg)  BMI 27.21 kg/m2  SpO2 97%   HENT:  Head: Normocephalic and atraumatic.  Right Ear: Hearing, tympanic membrane, external ear and ear canal normal.  Left Ear: Hearing, tympanic membrane, external ear and ear canal normal.  Nose: Nose normal.  Mouth/Throat: Uvula is midline, oropharynx is clear and moist and mucous membranes are normal.  He does not have dentures. No oral lesions. No trismus in the jaw. Normal dentition. No dental abscesses, uvula swelling, lacerations or dental caries.  Eyes: Conjunctivae, EOM and lids are normal. Pupils are equal, round, and reactive to light. Right eye exhibits no discharge. Left eye exhibits no discharge. No scleral icterus.  Fundoscopic exam:      The right eye shows no arteriolar narrowing, no AV nicking, no exudate, no hemorrhage and no papilledema.       The left eye shows no arteriolar narrowing, no AV nicking, no exudate, no hemorrhage and no papilledema.  Neck: Normal range of motion, full passive range of  motion without pain and phonation normal. Neck supple. No spinous process tenderness and no muscular tenderness present. No rigidity. No tracheal deviation, no edema, no erythema and normal range of motion present. No thyromegaly present.  Cardiovascular: Normal rate, regular rhythm, S1 normal, S2 normal, normal heart sounds, intact distal pulses and normal pulses.  Exam reveals no gallop and no friction rub.   No murmur heard. Pulmonary/Chest: Effort normal and breath sounds normal. No respiratory distress. He has no wheezes. He has no rales.  Abdominal: Soft. Normal appearance and bowel sounds are normal. He exhibits no distension and no mass. There is no hepatosplenomegaly. There is no tenderness. There is no rebound and no guarding. A hernia is present. Hernia confirmed positive in the right inguinal area (mild fullness on inspection, no bulging with valsalva/cough). Hernia confirmed negative in the left inguinal area.  Genitourinary: Testes normal and penis normal. Circumcised. No phimosis, paraphimosis, hypospadias, penile erythema or penile tenderness. No discharge found.  Musculoskeletal: Normal range of motion. He exhibits no edema or tenderness.       Cervical back: Normal. He exhibits normal range of motion, no tenderness, no bony tenderness, no swelling, no edema, no deformity, no laceration, no pain, no spasm and normal pulse.       Thoracic back: Normal.       Lumbar back: Normal.  Lymphadenopathy:       Head (right side): No submental, no submandibular, no tonsillar, no preauricular, no posterior auricular and no occipital adenopathy present.       Head (left side): No submental, no submandibular, no tonsillar, no preauricular, no posterior auricular and no occipital adenopathy present.    He has no cervical adenopathy.       Right: No inguinal and no supraclavicular adenopathy present.       Left: No inguinal and no supraclavicular adenopathy present.  Neurological: He is alert and  oriented to person, place, and time. He has normal strength and normal reflexes. He displays no tremor. No cranial nerve deficit. He exhibits normal muscle tone. Coordination and gait normal.  Skin: Skin is warm, dry and intact. No abrasion, no ecchymosis, no laceration, no lesion and no rash noted. He is not diaphoretic. No cyanosis or erythema. No pallor. Nails show no clubbing.  Psychiatric: He has a normal mood and affect. His speech is normal and behavior is normal. Judgment and thought content normal. Cognition and memory are normal.   PSA 05/29/15 at work health screening was 0.4. Lipids in 01/2015 revealed mild elevation of the TG, low HDL.        Assessment & Plan:  1. Annual physical exam Age appropriate anticipatory guidance provided.  2. Unilateral inguinal hernia without obstruction or gangrene, recurrence not specified Not painful. Anticipatory guidance provided.  3. BMI 27.0-27.9,adult Healthy eating, regular exercise.  4. Essential hypertension Smoking  cessation. Increased exercise. Healthy eating choices. Not interested in increasing dose yet. Wants to work on healthy lifestyle changes. Re-evaluate in 3 months. If remains >140/90 will increase losartan dose.  5. Need for Tdap vaccination - Tdap vaccine greater than or equal to 7yo IM   Fernande Bras, PA-C Physician Assistant-Certified [facility]

## 2015-07-11 NOTE — Addendum Note (Signed)
Addended by: Carmelina DaneANDERSON, Ericca Labra S on: 07/11/2015 05:23 PM   Modules accepted: Kipp BroodSmartSet

## 2015-07-11 NOTE — Progress Notes (Signed)
  Medical screening examination/treatment/procedure(s) were performed by non-physician practitioner and as supervising physician I was immediately available for consultation/collaboration.     

## 2016-04-25 ENCOUNTER — Other Ambulatory Visit: Payer: Self-pay | Admitting: Physician Assistant

## 2016-04-25 DIAGNOSIS — I10 Essential (primary) hypertension: Secondary | ICD-10-CM

## 2016-07-03 ENCOUNTER — Ambulatory Visit (INDEPENDENT_AMBULATORY_CARE_PROVIDER_SITE_OTHER): Payer: BLUE CROSS/BLUE SHIELD | Admitting: Urgent Care

## 2016-07-03 VITALS — BP 126/88 | HR 103 | Temp 98.7°F | Resp 18 | Ht 67.25 in | Wt 178.0 lb

## 2016-07-03 DIAGNOSIS — R51 Headache: Secondary | ICD-10-CM | POA: Diagnosis not present

## 2016-07-03 DIAGNOSIS — R059 Cough, unspecified: Secondary | ICD-10-CM

## 2016-07-03 DIAGNOSIS — R05 Cough: Secondary | ICD-10-CM | POA: Diagnosis not present

## 2016-07-03 DIAGNOSIS — R0789 Other chest pain: Secondary | ICD-10-CM

## 2016-07-03 DIAGNOSIS — R0981 Nasal congestion: Secondary | ICD-10-CM

## 2016-07-03 DIAGNOSIS — H6123 Impacted cerumen, bilateral: Secondary | ICD-10-CM

## 2016-07-03 DIAGNOSIS — R519 Headache, unspecified: Secondary | ICD-10-CM

## 2016-07-03 MED ORDER — BENZONATATE 100 MG PO CAPS: 100.0000 mg | ORAL_CAPSULE | Freq: Three times a day (TID) | ORAL | 0 refills | Status: DC | PRN

## 2016-07-03 MED ORDER — HYDROCODONE-HOMATROPINE 5-1.5 MG/5ML PO SYRP: 5.0000 mL | ORAL_SOLUTION | Freq: Every evening | ORAL | 0 refills | Status: DC | PRN

## 2016-07-03 MED ORDER — PSEUDOEPHEDRINE HCL ER 120 MG PO TB12: 120.0000 mg | ORAL_TABLET | Freq: Two times a day (BID) | ORAL | 3 refills | Status: DC

## 2016-07-03 NOTE — Progress Notes (Addendum)
    MRN: [id number] DOB: 1971/04/19  Subjective:   Eric Pham is a 45 y.o. male presenting for chief complaint of Cough  Reports 4 day history dry cough, subjective fever, chills, headache, sinus congestion, sore throat, bilateral ear pain. Cough elicits ear pain, chest pain, shob, worst at night, keeps patient awake at night. Has tried APAP with minimal relief. Denies n/v, abdominal pain, rashes. Denies smoking cigarettes. Denies history of asthma. Has not had flu shot this season.  [name] has a current medication list which includes the following prescription(s): losartan. Also has No Known Allergies.  [name]  has a past medical history of Allergy; Epistaxis; Hemorrhoids; and Hypertension. Also  has a past surgical history that includes Hernia repair (Left, 2009).  His family history includes Diabetes in his brother and sister; Hypertension in his brother and sister.  Objective:   Vitals: BP 126/88 (BP Location: Right Arm, Patient Position: Sitting, Cuff Size: Small)   Pulse (!) 103   Temp 98.7 F (37.1 C) (Oral)   Resp 18   Ht 5' 7.25" (1.708 m)   Wt 178 lb (80.7 kg)   SpO2 99%   BMI 27.67 kg/m   Physical Exam  Constitutional: He is oriented to person, place, and time. He appears well-developed and well-nourished.  HENT:  TM's cerumen occluded bilaterally. No tragus tenderness. Nasal turbinates pink and moist with thick nasal discharge, nasal passages minimally patent. Frontal and bilateral maxillary sinus tenderness. Oropharynx clear, mucous membranes moist, dentition in good repair.  Eyes: Right eye exhibits no discharge. Left eye exhibits no discharge.  Neck: Normal range of motion. Neck supple.  Cardiovascular: Normal rate, regular rhythm and intact distal pulses.  Exam reveals no gallop and no friction rub.   No murmur heard. Pulmonary/Chest: No respiratory distress. He has no wheezes. He has no rales.  Lymphadenopathy:    He has no cervical adenopathy.    Neurological: He is alert and oriented to person, place, and time.  Skin: Skin is warm.   Assessment and Plan :   1. Cough 2. Atypical chest pain 3. Nasal congestion 4. Sinus headache - Physical exam findings reassuring. Patient will try supportive care. Follow up on 07/07/2016 at which point we will do a complete annual physical as well.  5. Bilateral impacted cerumen - Ear wax removal   Wallis BambergMario Dalary Hollar, PA-C [facility] [phone] 07/03/2016 3:05 PM

## 2016-07-03 NOTE — Addendum Note (Signed)
Addended by: Wallis BambergMANI, Ranard Harte on: 07/03/2016 03:23 PM   Modules accepted: Orders

## 2016-07-03 NOTE — Patient Instructions (Addendum)
Cough, Adult Coughing is a reflex that clears your throat and your airways. Coughing helps to heal and protect your lungs. It is normal to cough occasionally, but a cough that happens with other symptoms or lasts a long time may be a sign of a condition that needs treatment. A cough may last only 2-3 weeks (acute), or it may last longer than 8 weeks (chronic). What are the causes? Coughing is commonly caused by:  Breathing in substances that irritate your lungs.  A viral or bacterial respiratory infection.  Allergies.  Asthma.  Postnasal drip.  Smoking.  Acid backing up from the stomach into the esophagus (gastroesophageal reflux).  Certain medicines.  Chronic lung problems, including COPD (or rarely, lung cancer).  Other medical conditions such as heart failure. Follow these instructions at home: Pay attention to any changes in your symptoms. Take these actions to help with your discomfort:  Take medicines only as told by your health care provider.  If you were prescribed an antibiotic medicine, take it as told by your health care provider. Do not stop taking the antibiotic even if you start to feel better.  Talk with your health care provider before you take a cough suppressant medicine.  Drink enough fluid to keep your urine clear or pale yellow.  If the air is dry, use a cold steam vaporizer or humidifier in your bedroom or your home to help loosen secretions.  Avoid anything that causes you to cough at work or at home.  If your cough is worse at night, try sleeping in a semi-upright position.  Avoid cigarette smoke. If you smoke, quit smoking. If you need help quitting, ask your health care provider.  Avoid caffeine.  Avoid alcohol.  Rest as needed. Contact a health care provider if:  You have new symptoms.  You cough up pus.  Your cough does not get better after 2-3 weeks, or your cough gets worse.  You cannot control your cough with suppressant medicines  and you are losing sleep.  You develop pain that is getting worse or pain that is not controlled with pain medicines.  You have a fever.  You have unexplained weight loss.  You have night sweats. Get help right away if:  You cough up blood.  You have difficulty breathing.  Your heartbeat is very fast. This information is not intended to replace advice given to you by your health care provider. Make sure you discuss any questions you have with your health care provider. Document Released: 12/26/2010 Document Revised: 12/05/2015 Document Reviewed: 09/05/2014 Elsevier Interactive Patient Education  2017 Elsevier Inc.     IF you received an x-ray today, you will receive an invoice from [facility]. Please contact  Radiology at [phone] with questions or concerns regarding your invoice.   IF you received labwork today, you will receive an invoice from [facility]. Please contact [facility] at [phone] with questions or concerns regarding your invoice.   Our billing staff will not be able to assist you with questions regarding bills from these companies.  You will be contacted with the lab results as soon as they are available. The fastest way to get your results is to activate your My Chart account. Instructions are located on the last page of this paperwork. If you have not heard from us regarding the results in 2 weeks, please contact this office.      

## 2016-07-07 ENCOUNTER — Ambulatory Visit (INDEPENDENT_AMBULATORY_CARE_PROVIDER_SITE_OTHER): Payer: BLUE CROSS/BLUE SHIELD | Admitting: Urgent Care

## 2016-07-07 VITALS — BP 112/82 | HR 90 | Temp 98.3°F | Resp 18 | Ht 67.25 in | Wt 180.0 lb

## 2016-07-07 DIAGNOSIS — Z0000 Encounter for general adult medical examination without abnormal findings: Secondary | ICD-10-CM | POA: Diagnosis not present

## 2016-07-07 DIAGNOSIS — I10 Essential (primary) hypertension: Secondary | ICD-10-CM | POA: Diagnosis not present

## 2016-07-07 MED ORDER — LOSARTAN POTASSIUM 50 MG PO TABS: 50.0000 mg | ORAL_TABLET | Freq: Every day | ORAL | 1 refills | Status: DC

## 2016-07-07 NOTE — Patient Instructions (Addendum)
Keeping you healthy  Get these tests  Blood pressure- Have your blood pressure checked once a year by your healthcare provider.  Normal blood pressure is 120/80  Weight- Have your body mass index (BMI) calculated to screen for obesity.  BMI is a measure of body fat based on height and weight. You can also calculate your own BMI at ViewBanking.si.  Cholesterol- Have your cholesterol checked every year.  Diabetes- Have your blood sugar checked regularly if you have high blood pressure, high cholesterol, have a family history of diabetes or if you are overweight.  Screening for Colon Cancer- Colonoscopy starting at age 53.  Screening may begin sooner depending on your family history and other health conditions. Follow up colonoscopy as directed by your Gastroenterologist.  Screening for Prostate Cancer- Both blood work (PSA) and a rectal exam help screen for Prostate Cancer.  Screening begins at age 1 with African-American men and at age 25 with Caucasian men.  Screening may begin sooner depending on your family history.  Take these medicines  Aspirin- One aspirin daily can help prevent Heart disease and Stroke.  Flu shot- Every fall.  Tetanus- Every 10 years.  Zostavax- Once after the age of 80 to prevent Shingles.  Pneumonia shot- Once after the age of 46; if you are younger than 70, ask your healthcare provider if you need a Pneumonia shot.  Take these steps  Don't smoke- If you do smoke, talk to your doctor about quitting.  For tips on how to quit, go to www.smokefree.gov or call 1-800-QUIT-NOW.  Be physically active- Exercise 5 days a week for at least 30 minutes.  If you are not already physically active start slow and gradually work up to 30 minutes of moderate physical activity.  Examples of moderate activity include walking briskly, mowing the yard, dancing, swimming, bicycling, etc.  Eat a healthy diet- Eat a variety of healthy food such as fruits, vegetables, low  fat milk, low fat cheese, yogurt, lean meant, poultry, fish, beans, tofu, etc. For more information go to www.thenutritionsource.org  Drink alcohol in moderation- Limit alcohol intake to less than two drinks a day. Never drink and drive.  Dentist- Brush and floss twice daily; visit your dentist twice a year.  Depression- Your emotional health is as important as your physical health. If you're feeling down, or losing interest in things you would normally enjoy please talk to your healthcare provider.  Eye exam- Visit your eye doctor every year.  Safe sex- If you may be exposed to a sexually transmitted infection, use a condom.  Seat belts- Seat belts can save your life; always wear one.  Smoke/Carbon Monoxide detectors- These detectors need to be installed on the appropriate level of your home.  Replace batteries at least once a year.  Skin cancer- When out in the sun, cover up and use sunscreen 15 SPF or higher.  Violence- If anyone is threatening you, please tell your healthcare provider.  Living Will/ Health care power of attorney- Speak with your healthcare provider and family.    Hypertension Hypertension, commonly called high blood pressure, is when the force of blood pumping through your arteries is too strong. Your arteries are the blood vessels that carry blood from your heart throughout your body. A blood pressure reading consists of a higher number over a lower number, such as 110/72. The higher number (systolic) is the pressure inside your arteries when your heart pumps. The lower number (diastolic) is the pressure inside your arteries when  your heart relaxes. Ideally you want your blood pressure below 120/80. Hypertension forces your heart to work harder to pump blood. Your arteries may become narrow or stiff. Having untreated or uncontrolled hypertension can cause heart attack, stroke, kidney disease, and other problems. What increases the risk? Some risk factors for high  blood pressure are controllable. Others are not. Risk factors you cannot control include:  Race. You may be at higher risk if you are African American.  Age. Risk increases with age.  Gender. Men are at higher risk than women before age 46 years. After age 46, women are at higher risk than men. Risk factors you can control include:  Not getting enough exercise or physical activity.  Being overweight.  Getting too much fat, sugar, calories, or salt in your diet.  Drinking too much alcohol. What are the signs or symptoms? Hypertension does not usually cause signs or symptoms. Extremely high blood pressure (hypertensive crisis) may cause headache, anxiety, shortness of breath, and nosebleed. How is this diagnosed? To check if you have hypertension, your health care provider will measure your blood pressure while you are seated, with your arm held at the level of your heart. It should be measured at least twice using the same arm. Certain conditions can cause a difference in blood pressure between your right and left arms. A blood pressure reading that is higher than normal on one occasion does not mean that you need treatment. If it is not clear whether you have high blood pressure, you may be asked to return on a different day to have your blood pressure checked again. Or, you may be asked to monitor your blood pressure at home for 1 or more weeks. How is this treated? Treating high blood pressure includes making lifestyle changes and possibly taking medicine. Living a healthy lifestyle can help lower high blood pressure. You may need to change some of your habits. Lifestyle changes may include:  Following the DASH diet. This diet is high in fruits, vegetables, and whole grains. It is low in salt, red meat, and added sugars.  Keep your sodium intake below 2,300 mg per day.  Getting at least 30-45 minutes of aerobic exercise at least 4 times per week.  Losing weight if necessary.  Not  smoking.  Limiting alcoholic beverages.  Learning ways to reduce stress. Your health care provider may prescribe medicine if lifestyle changes are not enough to get your blood pressure under control, and if one of the following is true:  You are 5118-46 years of age and your systolic blood pressure is above 140.  You are 860 years of age or older, and your systolic blood pressure is above 150.  Your diastolic blood pressure is above 90.  You have diabetes, and your systolic blood pressure is over 140 or your diastolic blood pressure is over 90.  You have kidney disease and your blood pressure is above 140/90.  You have heart disease and your blood pressure is above 140/90. Your personal target blood pressure may vary depending on your medical conditions, your age, and other factors. Follow these instructions at home:  Have your blood pressure rechecked as directed by your health care provider.  Take medicines only as directed by your health care provider. Follow the directions carefully. Blood pressure medicines must be taken as prescribed. The medicine does not work as well when you skip doses. Skipping doses also puts you at risk for problems.  Do not smoke.  Monitor your blood  pressure at home as directed by your health care provider. Contact a health care provider if:  You think you are having a reaction to medicines taken.  You have recurrent headaches or feel dizzy.  You have swelling in your ankles.  You have trouble with your vision. Get help right away if:  You develop a severe headache or confusion.  You have unusual weakness, numbness, or feel faint.  You have severe chest or abdominal pain.  You vomit repeatedly.  You have trouble breathing. This information is not intended to replace advice given to you by your health care provider. Make sure you discuss any questions you have with your health care provider. Document Released: 06/29/2005 Document Revised:  12/05/2015 Document Reviewed: 04/21/2013 Elsevier Interactive Patient Education  2017 [employer]    IF you received an x-ray today, you will receive an invoice from [facility]. Please contact [facility] at [phone] with questions or concerns regarding your invoice.   IF you received labwork today, you will receive an invoice from [facility]. Please contact [facility] at [phone] with questions or concerns regarding your invoice.   Our billing staff will not be able to assist you with questions regarding bills from these companies.  You will be contacted with the lab results as soon as they are available. The fastest way to get your results is to activate your My Chart account. Instructions are located on the last page of this paperwork. If you have not heard from us regarding the results in 2 weeks, please contact this office.

## 2016-07-07 NOTE — Progress Notes (Addendum)
MRN: [id number]  Subjective:   [name] is a 45 y.o. male presenting for annual physical exam.  Patient is married, has 1 child. Has good relationships at home, has a good support network. Patient is sexually active with his wife only. Declines STI testing. Works with [employer], manufactures bathroom supplies. Denies smoking cigarettes or drinking alcohol.   Medical care team includes: PCP: No PCP Per Patient Vision: Wears eye glasses, gets eye exams yearly. Dental: Cleanings every 6 months. Specialists: None.  Eric Pham has a current medication list which includes the following prescription(s): benzonatate, hydrocodone-homatropine, losartan, and pseudoephedrine. He has No Known Allergies.  Eric Pham  has a past medical history of Allergy; Epistaxis; Hemorrhoids; and Hypertension. Also  has a past surgical history that includes Hernia repair (Left, 2009).  His family history includes Diabetes in his brother and sister; Hypertension in his brother and sister.  Immunizations: Cannot receive flu shot today due to recent illness.  Review of Systems  Constitutional: Negative for chills, diaphoresis, fever, malaise/fatigue and weight loss.  HENT: Negative for congestion, ear discharge, ear pain, hearing loss, nosebleeds, sore throat and tinnitus.   Eyes: Negative for blurred vision, double vision, photophobia, pain, discharge and redness.  Respiratory: Negative for cough, shortness of breath and wheezing.   Cardiovascular: Negative for chest pain, palpitations and leg swelling.  Gastrointestinal: Negative for abdominal pain, blood in stool, constipation, diarrhea, nausea and vomiting.  Genitourinary: Negative for dysuria, flank pain, frequency, hematuria and urgency.  Musculoskeletal: Negative for back pain, joint pain and myalgias.  Skin: Negative for itching and rash.  Neurological: Negative for dizziness, tingling, seizures, loss of consciousness, weakness and headaches.    Endo/Heme/Allergies: Negative for polydipsia.  Psychiatric/Behavioral: Negative for depression, hallucinations, memory loss, substance abuse and suicidal ideas. The patient is not nervous/anxious and does not have insomnia.    Objective:   Vitals: BP 112/82 (BP Location: Right Arm, Patient Position: Sitting, Cuff Size: Small)   Pulse 90   Temp 98.3 F (36.8 C) (Oral)   Resp 18   Ht 5' 7.25" (1.708 m)   Wt 180 lb (81.6 kg)   SpO2 96%   BMI 27.98 kg/m   BP Readings from Last 3 Encounters:  07/07/16 112/82  07/03/16 126/88  07/08/15 (!) 158/98    Physical Exam  Constitutional: He is oriented to person, place, and time. He appears well-developed and well-nourished.  HENT:  TM's intact bilaterally, no effusions or erythema. Nasal turbinates pink and moist, nasal passages patent. No sinus tenderness. Oropharynx clear, mucous membranes moist, dentition in good repair.  Eyes: Conjunctivae and EOM are normal. Pupils are equal, round, and reactive to light. Right eye exhibits no discharge. Left eye exhibits no discharge. No scleral icterus.  Neck: Normal range of motion. Neck supple. No thyromegaly present.  Cardiovascular: Normal rate, regular rhythm and intact distal pulses.  Exam reveals no gallop and no friction rub.   No murmur heard. Pulmonary/Chest: No stridor. No respiratory distress. He has no wheezes. He has no rales.  Abdominal: Soft. Bowel sounds are normal. He exhibits no distension and no mass. There is no tenderness.  Musculoskeletal: Normal range of motion. He exhibits no edema or tenderness.  Lymphadenopathy:    He has no cervical adenopathy.  Neurological: He is alert and oriented to person, place, and time. He has normal reflexes.  Skin: Skin is warm and dry. No rash noted. No erythema. No pallor.  Psychiatric: He has a normal mood and affect.  Assessment and Plan :   1. Annual physical exam - Medically healthy and very pleasant patient. Discussed healthy  lifestyle, diet, exercise, preventative care, vaccinations, and addressed patient's concerns.  - Patient will come in 1 week for his flu shot to make sure he clears his viral illness.   2. Essential hypertension, benign - Well controlled. Refilled medications for 6 months. Recheck at that point. - losartan (COZAAR) 50 MG tablet; Take 1 tablet (50 mg total) by mouth daily.  Dispense: 90 tablet; Refill: 1 - Microalbumin, urine   Eric BambergMario Jeneva Schweizer, PA-C [facility] [phone] 07/07/2016  9:44 AM

## 2016-07-08 LAB — CBC
Hematocrit: 47.5 % (ref 37.5–51.0)
Hemoglobin: 16 g/dL (ref 13.0–17.7)
MCH: 25.7 pg — ABNORMAL LOW (ref 26.6–33.0)
MCHC: 33.7 g/dL (ref 31.5–35.7)
MCV: 76 fL — AB (ref 79–97)
Platelets: 155 10*3/uL (ref 150–379)
RBC: 6.22 x10E6/uL — ABNORMAL HIGH (ref 4.14–5.80)
RDW: 14.7 % (ref 12.3–15.4)
WBC: 5.8 10*3/uL (ref 3.4–10.8)

## 2016-07-08 LAB — CMP14+EGFR
A/G RATIO: 1.3 (ref 1.2–2.2)
ALK PHOS: 116 IU/L (ref 39–117)
ALT: 17 IU/L (ref 0–44)
AST: 16 IU/L (ref 0–40)
Albumin: 3.9 g/dL (ref 3.5–5.5)
BILIRUBIN TOTAL: 0.7 mg/dL (ref 0.0–1.2)
BUN/Creatinine Ratio: 15 (ref 9–20)
BUN: 13 mg/dL (ref 6–24)
CALCIUM: 9.5 mg/dL (ref 8.7–10.2)
CHLORIDE: 99 mmol/L (ref 96–106)
CO2: 21 mmol/L (ref 18–29)
Creatinine, Ser: 0.88 mg/dL (ref 0.76–1.27)
GFR calc Af Amer: 119 mL/min/{1.73_m2} (ref >59)
GFR, EST NON AFRICAN AMERICAN: 103 mL/min/{1.73_m2} (ref >59)
Globulin, Total: 2.9 g/dL (ref 1.5–4.5)
Glucose: 179 mg/dL — ABNORMAL HIGH (ref 65–99)
POTASSIUM: 4.3 mmol/L (ref 3.5–5.2)
Sodium: 137 mmol/L (ref 134–144)
Total Protein: 6.8 g/dL (ref 6.0–8.5)

## 2016-07-08 LAB — LIPID PANEL
CHOLESTEROL TOTAL: 173 mg/dL (ref 100–199)
Chol/HDL Ratio: 7.2 ratio units — ABNORMAL HIGH (ref 0.0–5.0)
HDL: 24 mg/dL — AB (ref >39)
LDL Calculated: 94 mg/dL (ref 0–99)
TRIGLYCERIDES: 276 mg/dL — AB (ref 0–149)
VLDL Cholesterol Cal: 55 mg/dL — ABNORMAL HIGH (ref 5–40)

## 2016-07-08 LAB — MICROALBUMIN, URINE

## 2016-07-10 NOTE — Addendum Note (Signed)
Addended by: Baldwin CrownJOHNSON, Harrel Ferrone D on: 07/10/2016 09:17 AM   Modules accepted: Orders

## 2016-07-11 LAB — HEMOGLOBIN A1C
ESTIMATED AVERAGE GLUCOSE: 134 mg/dL
Hgb A1c MFr Bld: 6.3 % — ABNORMAL HIGH (ref 4.8–5.6)

## 2016-07-11 LAB — SPECIMEN STATUS REPORT

## 2016-12-30 ENCOUNTER — Telehealth: Payer: Self-pay | Admitting: Urgent Care

## 2016-12-30 DIAGNOSIS — I10 Essential (primary) hypertension: Secondary | ICD-10-CM

## 2016-12-30 NOTE — Telephone Encounter (Signed)
mychart message sent to pt about making an appt °

## 2017-01-31 ENCOUNTER — Other Ambulatory Visit: Payer: Self-pay | Admitting: Urgent Care

## 2017-01-31 DIAGNOSIS — I10 Essential (primary) hypertension: Secondary | ICD-10-CM

## 2017-02-10 ENCOUNTER — Other Ambulatory Visit: Payer: Self-pay | Admitting: Urgent Care

## 2017-02-10 ENCOUNTER — Ambulatory Visit (INDEPENDENT_AMBULATORY_CARE_PROVIDER_SITE_OTHER): Payer: BLUE CROSS/BLUE SHIELD | Admitting: Urgent Care

## 2017-02-10 ENCOUNTER — Encounter: Payer: Self-pay | Admitting: Urgent Care

## 2017-02-10 DIAGNOSIS — I10 Essential (primary) hypertension: Secondary | ICD-10-CM

## 2017-02-10 MED ORDER — LOSARTAN POTASSIUM 50 MG PO TABS: 50.0000 mg | ORAL_TABLET | Freq: Every day | ORAL | 1 refills | Status: DC

## 2017-02-10 NOTE — Patient Instructions (Addendum)
Hypertension Hypertension, commonly called high blood pressure, is when the force of blood pumping through the arteries is too strong. The arteries are the blood vessels that carry blood from the heart throughout the body. Hypertension forces the heart to work harder to pump blood and may cause arteries to become narrow or stiff. Having untreated or uncontrolled hypertension can cause heart attacks, strokes, kidney disease, and other problems. A blood pressure reading consists of a higher number over a lower number. Ideally, your blood pressure should be below 120/80. The first ("top") number is called the systolic pressure. It is a measure of the pressure in your arteries as your heart beats. The second ("bottom") number is called the diastolic pressure. It is a measure of the pressure in your arteries as the heart relaxes. What are the causes? The cause of this condition is not known. What increases the risk? Some risk factors for high blood pressure are under your control. Others are not. Factors you can change  Smoking.  Having type 2 diabetes mellitus, high cholesterol, or both.  Not getting enough exercise or physical activity.  Being overweight.  Having too much fat, sugar, calories, or salt (sodium) in your diet.  Drinking too much alcohol. Factors that are difficult or impossible to change  Having chronic kidney disease.  Having a family history of high blood pressure.  Age. Risk increases with age.  Race. You may be at higher risk if you are African-American.  Gender. Men are at higher risk than women before age 45. After age 65, women are at higher risk than men.  Having obstructive sleep apnea.  Stress. What are the signs or symptoms? Extremely high blood pressure (hypertensive crisis) may cause:  Headache.  Anxiety.  Shortness of breath.  Nosebleed.  Nausea and vomiting.  Severe chest pain.  Jerky movements you cannot control (seizures).  How is this  diagnosed? This condition is diagnosed by measuring your blood pressure while you are seated, with your arm resting on a surface. The cuff of the blood pressure monitor will be placed directly against the skin of your upper arm at the level of your heart. It should be measured at least twice using the same arm. Certain conditions can cause a difference in blood pressure between your right and left arms. Certain factors can cause blood pressure readings to be lower or higher than normal (elevated) for a short period of time:  When your blood pressure is higher when you are in a health care provider's office than when you are at home, this is called white coat hypertension. Most people with this condition do not need medicines.  When your blood pressure is higher at home than when you are in a health care provider's office, this is called masked hypertension. Most people with this condition may need medicines to control blood pressure.  If you have a high blood pressure reading during one visit or you have normal blood pressure with other risk factors:  You may be asked to return on a different day to have your blood pressure checked again.  You may be asked to monitor your blood pressure at home for 1 week or longer.  If you are diagnosed with hypertension, you may have other blood or imaging tests to help your health care provider understand your overall risk for other conditions. How is this treated? This condition is treated by making healthy lifestyle changes, such as eating healthy foods, exercising more, and reducing your alcohol intake. Your   health care provider may prescribe medicine if lifestyle changes are not enough to get your blood pressure under control, and if:  Your systolic blood pressure is above 130.  Your diastolic blood pressure is above 80.  Your personal target blood pressure may vary depending on your medical conditions, your age, and other factors. Follow these  instructions at home: Eating and drinking  Eat a diet that is high in fiber and potassium, and low in sodium, added sugar, and fat. An example eating plan is called the DASH (Dietary Approaches to Stop Hypertension) diet. To eat this way: ? Eat plenty of fresh fruits and vegetables. Try to fill half of your plate at each meal with fruits and vegetables. ? Eat whole grains, such as whole wheat pasta, brown rice, or whole grain bread. Fill about one quarter of your plate with whole grains. ? Eat or drink low-fat dairy products, such as skim milk or low-fat yogurt. ? Avoid fatty cuts of meat, processed or cured meats, and poultry with skin. Fill about one quarter of your plate with lean proteins, such as fish, chicken without skin, beans, eggs, and tofu. ? Avoid premade and processed foods. These tend to be higher in sodium, added sugar, and fat.  Reduce your daily sodium intake. Most people with hypertension should eat less than 1,500 mg of sodium a day.  Limit alcohol intake to no more than 1 drink a day for nonpregnant women and 2 drinks a day for men. One drink equals 12 oz of beer, 5 oz of wine, or 1 oz of hard liquor. Lifestyle  Work with your health care provider to maintain a healthy body weight or to lose weight. Ask what an ideal weight is for you.  Get at least 30 minutes of exercise that causes your heart to beat faster (aerobic exercise) most days of the week. Activities may include walking, swimming, or biking.  Include exercise to strengthen your muscles (resistance exercise), such as pilates or lifting weights, as part of your weekly exercise routine. Try to do these types of exercises for 30 minutes at least 3 days a week.  Do not use any products that contain nicotine or tobacco, such as cigarettes and e-cigarettes. If you need help quitting, ask your health care provider.  Monitor your blood pressure at home as told by your health care provider.  Keep all follow-up visits as  told by your health care provider. This is important. Medicines  Take over-the-counter and prescription medicines only as told by your health care provider. Follow directions carefully. Blood pressure medicines must be taken as prescribed.  Do not skip doses of blood pressure medicine. Doing this puts you at risk for problems and can make the medicine less effective.  Ask your health care provider about side effects or reactions to medicines that you should watch for. Contact a health care provider if:  You think you are having a reaction to a medicine you are taking.  You have headaches that keep coming back (recurring).  You feel dizzy.  You have swelling in your ankles.  You have trouble with your vision. Get help right away if:  You develop a severe headache or confusion.  You have unusual weakness or numbness.  You feel faint.  You have severe pain in your chest or abdomen.  You vomit repeatedly.  You have trouble breathing. Summary  Hypertension is when the force of blood pumping through your arteries is too strong. If this condition is not   controlled, it may put you at risk for serious complications.  Your personal target blood pressure may vary depending on your medical conditions, your age, and other factors. For most people, a normal blood pressure is less than 120/80.  Hypertension is treated with lifestyle changes, medicines, or a combination of both. Lifestyle changes include weight loss, eating a healthy, low-sodium diet, exercising more, and limiting alcohol. This information is not intended to replace advice given to you by your health care provider. Make sure you discuss any questions you have with your health care provider. Document Released: 06/29/2005 Document Revised: 05/27/2016 Document Reviewed: 05/27/2016 Elsevier Interactive Patient Education  2018 Elsevier Inc.     IF you received an x-ray today, you will receive an invoice from [facility]. Please contact [facility] at [phone] with questions or concerns regarding your invoice.   IF you received labwork today, you will receive an invoice from [facility]. Please contact [facility] at [phone] with questions or concerns regarding your invoice.   Our billing staff will not be able to assist you with questions regarding bills from these companies.  You will be contacted with the lab results as soon as they are available. The fastest way to get your results is to activate your My Chart account. Instructions are located on the last page of this paperwork. If you have not heard from us regarding the results in 2 weeks, please contact this office.     

## 2017-02-10 NOTE — Progress Notes (Signed)
   MRN: [id number] DOB: [birth year]  Subjective:   Eric Pham is a 46 y.o. male presenting for follow up on Hypertension.   Currently managed with losartan. Patient has 1 month left of medications, plans on going to [place] for ~2 months. Patient is checking blood pressure at home, generally 120-130's systolic. Avoids salt in diet, is not exercising but states that he stays active. Denies dizziness, chronic headache, blurred vision, chest pain, shortness of breath, heart racing, palpitations, nausea, vomiting, abdominal pain, hematuria, lower leg swelling. Denies smoking cigarettes or drinking alcohol.   Eric Pham has a current medication list which includes the following prescription(s): losartan. Also has No Known Allergies.  Eric Pham  has a past medical history of Allergy; Epistaxis; Hemorrhoids; and Hypertension. Also  has a past surgical history that includes Hernia repair (Left, 2009).  Objective:   Vitals: BP 128/86 (BP Location: Left Arm, Cuff Size: Normal)   Pulse 76   Temp 98.9 F (37.2 C) (Oral)   Resp 16   Ht 5' 7.25" (1.708 m)   Wt 167 lb 12.8 oz (76.1 kg)   SpO2 100%   BMI 26.09 kg/m   BP Readings from Last 3 Encounters:  02/10/17 128/86  07/07/16 112/82  07/03/16 126/88    Physical Exam  Constitutional: He is oriented to person, place, and time. He appears well-developed and well-nourished.  HENT:  Mouth/Throat: Oropharynx is clear and moist.  Cardiovascular: Normal rate, regular rhythm and intact distal pulses.  Exam reveals no gallop and no friction rub.   No murmur heard. Pulmonary/Chest: No respiratory distress. He has no wheezes. He has no rales.  Neurological: He is alert and oriented to person, place, and time.  Psychiatric: He has a normal mood and affect.   Assessment and Plan :   1. Essential hypertension, benign - Well controlled, refilled his losartan. Encouraged patient to maintain healthy lifestyle.  - losartan (COZAAR) 50 MG tablet; Take 1 tablet (50  mg total) by mouth daily.  Dispense: 90 tablet; Refill: 1 - Basic metabolic panel   Eric BambergMario Tyjuan Demetro, PA-C Primary [facility] at [facility] [phone] 02/10/2017  4:29 PM

## 2017-02-11 LAB — BASIC METABOLIC PANEL
BUN/Creatinine Ratio: 17 (ref 9–20)
BUN: 14 mg/dL (ref 6–24)
CHLORIDE: 104 mmol/L (ref 96–106)
CO2: 21 mmol/L (ref 20–29)
Calcium: 9.7 mg/dL (ref 8.7–10.2)
Creatinine, Ser: 0.83 mg/dL (ref 0.76–1.27)
GFR, EST AFRICAN AMERICAN: 121 mL/min/{1.73_m2} (ref >59)
GFR, EST NON AFRICAN AMERICAN: 105 mL/min/{1.73_m2} (ref >59)
Glucose: 85 mg/dL (ref 65–99)
POTASSIUM: 4.2 mmol/L (ref 3.5–5.2)
Sodium: 140 mmol/L (ref 134–144)

## 2017-08-17 ENCOUNTER — Other Ambulatory Visit: Payer: Self-pay | Admitting: *Deleted

## 2017-08-17 ENCOUNTER — Telehealth: Payer: Self-pay | Admitting: *Deleted

## 2017-08-17 DIAGNOSIS — I10 Essential (primary) hypertension: Secondary | ICD-10-CM

## 2017-08-17 MED ORDER — LOSARTAN POTASSIUM 50 MG PO TABS
50.0000 mg | ORAL_TABLET | Freq: Every day | ORAL | 0 refills | Status: DC
Start: 2017-08-17 — End: 2017-10-31

## 2017-08-19 ENCOUNTER — Other Ambulatory Visit: Payer: Self-pay | Admitting: Urgent Care

## 2017-08-19 DIAGNOSIS — I10 Essential (primary) hypertension: Secondary | ICD-10-CM

## 2017-10-30 ENCOUNTER — Other Ambulatory Visit: Payer: Self-pay | Admitting: Urgent Care

## 2017-10-30 DIAGNOSIS — I10 Essential (primary) hypertension: Secondary | ICD-10-CM

## 2017-10-31 ENCOUNTER — Other Ambulatory Visit: Payer: Self-pay | Admitting: Urgent Care

## 2017-10-31 DIAGNOSIS — I10 Essential (primary) hypertension: Secondary | ICD-10-CM

## 2017-11-01 MED ORDER — LOSARTAN POTASSIUM 50 MG PO TABS: 50.0000 mg | ORAL_TABLET | Freq: Every day | ORAL | 0 refills | Status: DC

## 2017-11-08 ENCOUNTER — Ambulatory Visit (INDEPENDENT_AMBULATORY_CARE_PROVIDER_SITE_OTHER): Payer: BLUE CROSS/BLUE SHIELD

## 2017-11-08 ENCOUNTER — Encounter: Payer: Self-pay | Admitting: Family Medicine

## 2017-11-08 ENCOUNTER — Ambulatory Visit (INDEPENDENT_AMBULATORY_CARE_PROVIDER_SITE_OTHER): Payer: BLUE CROSS/BLUE SHIELD | Admitting: Family Medicine

## 2017-11-08 ENCOUNTER — Other Ambulatory Visit: Payer: Self-pay

## 2017-11-08 VITALS — BP 134/88 | HR 100 | Temp 98.5°F | Resp 20 | Ht 67.25 in | Wt 165.4 lb

## 2017-11-08 DIAGNOSIS — R7303 Prediabetes: Secondary | ICD-10-CM | POA: Diagnosis not present

## 2017-11-08 DIAGNOSIS — R05 Cough: Secondary | ICD-10-CM

## 2017-11-08 DIAGNOSIS — R1030 Lower abdominal pain, unspecified: Secondary | ICD-10-CM | POA: Diagnosis not present

## 2017-11-08 DIAGNOSIS — R509 Fever, unspecified: Secondary | ICD-10-CM | POA: Diagnosis not present

## 2017-11-08 DIAGNOSIS — R059 Cough, unspecified: Secondary | ICD-10-CM

## 2017-11-08 DIAGNOSIS — J181 Lobar pneumonia, unspecified organism: Secondary | ICD-10-CM

## 2017-11-08 DIAGNOSIS — R1011 Right upper quadrant pain: Secondary | ICD-10-CM | POA: Diagnosis not present

## 2017-11-08 DIAGNOSIS — R739 Hyperglycemia, unspecified: Secondary | ICD-10-CM | POA: Diagnosis not present

## 2017-11-08 DIAGNOSIS — J189 Pneumonia, unspecified organism: Secondary | ICD-10-CM

## 2017-11-08 LAB — POCT URINALYSIS DIP (MANUAL ENTRY)
BILIRUBIN UA: NEGATIVE
BILIRUBIN UA: NEGATIVE mg/dL
Blood, UA: NEGATIVE
Glucose, UA: NEGATIVE mg/dL
LEUKOCYTES UA: NEGATIVE
Nitrite, UA: NEGATIVE
Spec Grav, UA: >=1.03 — AB (ref 1.010–1.025)
Urobilinogen, UA: 1 E.U./dL
pH, UA: 5.5 (ref 5.0–8.0)

## 2017-11-08 LAB — POCT CBC
Granulocyte percent: 73.7 %G (ref 37–80)
HEMATOCRIT: 49.3 % (ref 43.5–53.7)
Hemoglobin: 16 g/dL (ref 14.1–18.1)
LYMPH, POC: 2.3 (ref 0.6–3.4)
MCH, POC: 25.5 pg — AB (ref 27–31.2)
MCHC: 32.5 g/dL (ref 31.8–35.4)
MCV: 78.6 fL — AB (ref 80–97)
MID (cbc): 0.4 (ref 0–0.9)
MPV: 9.8 fL (ref 0–99.8)
POC GRANULOCYTE: 7.7 — AB (ref 2–6.9)
POC LYMPH %: 22.5 % (ref 10–50)
POC MID %: 3.8 % (ref 0–12)
Platelet Count, POC: 193 10*3/uL (ref 142–424)
RBC: 6.27 M/uL — AB (ref 4.69–6.13)
RDW, POC: 13.9 %
WBC: 10.4 10*3/uL — AB (ref 4.6–10.2)

## 2017-11-08 MED ORDER — PROMETHAZINE-CODEINE 6.25-10 MG/5ML PO SYRP: 5.0000 mL | ORAL_SOLUTION | Freq: Four times a day (QID) | ORAL | 0 refills | Status: DC | PRN

## 2017-11-08 MED ORDER — MUCINEX DM MAXIMUM STRENGTH 60-1200 MG PO TB12
1.0000 | ORAL_TABLET | Freq: Two times a day (BID) | ORAL | 1 refills | Status: DC
Start: 2017-11-08 — End: 2018-01-06

## 2017-11-08 MED ORDER — AZITHROMYCIN 250 MG PO TABS
ORAL_TABLET | ORAL | 0 refills | Status: DC
Start: 2017-11-08 — End: 2018-01-06

## 2017-11-08 NOTE — Progress Notes (Addendum)
Subjective:  By signing my name below, I, Stann Ore, attest that this documentation has been prepared under the direction and in the presence of Norberto Sorenson, MD. Electronically Signed: Stann Ore, Scribe. 11/08/2017 , 6:06 PM .  Patient was seen in Room 1 .   Patient ID: Eric Pham, male    DOB: Jul 27, 1970, 47 y.o.   MRN: [id number] Chief Complaint  Patient presents with  . Cough    x1 week, pt states he is experiencing bodyache, cough, headache. Pt states he had noticed a slight fever at night time. Pt states he can't take a deep breathe.   HPI Eric Pham is a 47 y.o. male who presents to Primary Care at [facility] complaining of a cough that started about a week ago. Patient states he's been having deep hacking productive cough causing chest and some abdominal discomfort. He also notes having sore throat and loss of appetite as well, as well as fever, night sweats and chills at night. He also adds cough keeping him up at night. He's been taking tylenol with some relief. He denies any sick contact at home. He denies bowel or urinary symptoms, vomiting, nausea, diarrhea, rash, or change in skin color. He hasn't been to work since cough started.   Past Medical History:  Diagnosis Date  . Allergy   . Epistaxis   . Hemorrhoids   . Hypertension    Past Surgical History:  Procedure Laterality Date  . HERNIA REPAIR Left 2009   inguinal   Prior to Admission medications   Medication Sig Start Date End Date Taking? Authorizing Provider  losartan (COZAAR) 50 MG tablet Take 1 tablet (50 mg total) by mouth daily. Office visit needed 11/01/17  Yes Wallis Bamberg, PA-C   No Known Allergies Family History  Problem Relation Age of Onset  . Diabetes Sister   . Diabetes Brother   . Hypertension Brother   . Hypertension Sister    Social History   Socioeconomic History  . Marital status: Married    Spouse name: Ranya  . Number of children: 1  . Years of education: [employer]  . Highest  education level: Not on file  Occupational History  . Occupation: Information systems manager: [employer]  Social Needs  . Financial resource strain: Not on file  . Food insecurity:    Worry: Not on file    Inability: Not on file  . Transportation needs:    Medical: Not on file    Non-medical: Not on file  Tobacco Use  . Smoking status: Current Some Day Smoker  . Smokeless tobacco: Former Engineer, water and Sexual Activity  . Alcohol use: No    Alcohol/week: 0.0 oz  . Drug use: No  . Sexual activity: Yes    Partners: Female  Lifestyle  . Physical activity:    Days per week: Not on file    Minutes per session: Not on file  . Stress: Not on file  Relationships  . Social connections:    Talks on phone: Not on file    Gets together: Not on file    Attends religious service: Not on file    Active member of club or organization: Not on file    Attends meetings of clubs or organizations: Not on file    Relationship status: Not on file  Other Topics Concern  . Not on file  Social History Narrative   From [place]. Came to the [place] in 2009.  Lives with 3 friends.   His wife and son live in [place].   Depression screen [facility] 2/9 11/08/2017 02/10/2017 07/07/2016 07/03/2016 07/08/2015  Decreased Interest 0 0 0 0 0  Down, Depressed, Hopeless 0 0 0 0 0  PHQ - 2 Score 0 0 0 0 0    Review of Systems  Constitutional: Positive for appetite change, chills, diaphoresis, fatigue and fever.  HENT: Positive for sore throat.   Respiratory: Positive for cough and shortness of breath. Negative for wheezing.   Cardiovascular: Positive for chest pain (soreness).  Gastrointestinal: Positive for abdominal pain (soreness). Negative for constipation, diarrhea, nausea and vomiting.  Musculoskeletal: Positive for myalgias.  Skin: Negative for color change and rash.  Neurological: Positive for headaches.       Objective:   Physical Exam  Constitutional: He is oriented to person, place,  and time. He appears well-developed and well-nourished. No distress.  Diaphoretic and clammy  HENT:  Head: Normocephalic and atraumatic.  Right Ear: Tympanic membrane normal.  Left Ear: Tympanic membrane normal.  Nose: Mucosal edema present.  Mouth/Throat: Posterior oropharyngeal erythema present.  Eyes: Pupils are equal, round, and reactive to light. EOM are normal.  Neck: Neck supple.  Cardiovascular: Regular rhythm. Tachycardia present.  Pulmonary/Chest: Effort normal. No respiratory distress.  Lungs clear, but shallow breathing and hacking deep cough with any deep breathing  Abdominal: There is tenderness in the right upper quadrant, right lower quadrant and suprapubic area.  Musculoskeletal: Normal range of motion.  Lymphadenopathy:    He has cervical adenopathy (anterior cervical bilaterally).  Neurological: He is alert and oriented to person, place, and time.  Skin: Skin is warm and dry.  Psychiatric: He has a normal mood and affect. His behavior is normal.  Nursing note and vitals reviewed.   BP 134/88 (BP Location: Left Arm, Patient Position: Sitting, Cuff Size: Normal)   Pulse 100   Temp 98.5 F (36.9 C) (Oral)   Resp 20   Ht 5' 7.25" (1.708 m)   Wt 165 lb 6.4 oz (75 kg)   SpO2 97%   BMI 25.71 kg/m   Dg Chest 2 View  Result Date: 11/08/2017 CLINICAL DATA:  Hacking cough for 1 week, fever EXAM: CHEST - 2 VIEW COMPARISON:  None. FINDINGS: Right lung is clear. Patchy airspace disease in the lingula and left base. No pleural effusion. Normal heart size. No pneumothorax. IMPRESSION: Patchy airspace disease in the lingula and left lower lobe suspicious for pneumonia. Electronically Signed   By: Jasmine Pang M.D.   On: 11/08/2017 18:20       Results for orders placed or performed in visit on 11/08/17  POCT CBC  Result Value Ref Range   WBC 10.4 (A) 4.6 - 10.2 K/uL   Lymph, poc 2.3 0.6 - 3.4   POC LYMPH PERCENT 22.5 10 - 50 %L   MID (cbc) 0.4 0 - 0.9   POC MID % 3.8  0 - 12 %M   POC Granulocyte 7.7 (A) 2 - 6.9   Granulocyte percent 73.7 37 - 80 %G   RBC 6.27 (A) 4.69 - 6.13 M/uL   Hemoglobin 16.0 14.1 - 18.1 g/dL   HCT, POC 16.1 09.6 - 53.7 %   MCV 78.6 (A) 80 - 97 fL   MCH, POC 25.5 (A) 27 - 31.2 pg   MCHC 32.5 31.8 - 35.4 g/dL   RDW, POC 04.5 %   Platelet Count, POC 193 142 - 424 K/uL   MPV 9.8 0 -  99.8 fL  POCT urinalysis dipstick  Result Value Ref Range   Color, UA yellow yellow   Clarity, UA clear clear   Glucose, UA negative negative mg/dL   Bilirubin, UA negative negative   Ketones, POC UA negative negative mg/dL   Spec Grav, UA >=1.610 (A) 1.010 - 1.025   Blood, UA negative negative   pH, UA 5.5 5.0 - 8.0   Protein Ur, POC trace (A) negative mg/dL   Urobilinogen, UA 1.0 0.2 or 1.0 E.U./dL   Nitrite, UA Negative Negative   Leukocytes, UA Negative Negative    Assessment & Plan:   1. Pneumonia of left lower lobe due to infectious organism [facility]) - start azithro zpack - if pt does not have sig improvement in 48-72 hrs, or if worsening at all, RTC. Can use codeine syrup while at home and o/n but just use mucinex dm for cough suppression when returning to work - cautioned that cough may last for up to 3 weeks but after the next week he should be feeling back to normal - RTC if not.  Push fluids.  2. Cough   3. Fever, unspecified   4. Lower abdominal pain   5. Abdominal pain, right upper quadrant     Orders Placed This Encounter  Procedures  . DG Chest 2 View    Standing Status:   Future    Number of Occurrences:   1    Standing Expiration Date:   11/08/2018    Order Specific Question:   Reason for Exam (SYMPTOM  OR DIAGNOSIS REQUIRED)    Answer:   severe hacking cough x 1 wk, fever, cervical adenopathy, abdominal pain    Order Specific Question:   Preferred imaging location?    Answer:   External  . Comprehensive metabolic panel  . POCT CBC  . POCT urinalysis dipstick    Meds ordered this encounter  Medications  .  azithromycin (ZITHROMAX) 250 MG tablet    Sig: Take 2 tabs PO x 1 dose, then 1 tab PO QD x 4 days    Dispense:  6 tablet    Refill:  0  . Dextromethorphan-guaiFENesin (MUCINEX DM MAXIMUM STRENGTH) 60-1200 MG TB12    Sig: Take 1 tablet by mouth every 12 (twelve) hours.    Dispense:  14 each    Refill:  1  . promethazine-codeine (PHENERGAN WITH CODEINE) 6.25-10 MG/5ML syrup    Sig: Take 5-10 mLs by mouth every 6 (six) hours as needed for cough.    Dispense:  200 mL    Refill:  0    I personally performed the services described in this documentation, which was scribed in my presence. The recorded information has been reviewed and considered, and addended by me as needed.   Norberto Sorenson, M.D.  Primary Care at [facility] [address] [place], [place] [address] [phone] phone [phone] fax  11/08/17 6:36 PM

## 2017-11-08 NOTE — Patient Instructions (Addendum)
Try to drink as many fluids as you can - drink a lot of water, Pedialyte, gatorade, sports drinks, broth, etc. I recommend frequent warm salt water gargles, hot tea with honey and lemon, rest, and handwashing.  Hot showers or breathing in steam may help loosen the congestion.  You may continue to cough for up to 3 weeks but I would not expect you to feel bad after another week. If you are not getting distinctly better by Thursday and feeling back to normal other than a cough in a week, please come back in for further evaluation immediately. Use the cough syrup to help you sleep and help treat the abdominal pain while you are home from work and sleeping. Do not take this when you are needing to be awake and alert such as driving or working.  You can take the mucinex DM for that or may call for a different cough medicine if needed when you go back to work.  IF you received an x-ray today, you will receive an invoice from [facility]. Please contact [facility] at [phone] with questions or concerns regarding your invoice.   IF you received labwork today, you will receive an invoice from [facility]. Please contact [facility] at [phone] with questions or concerns regarding your invoice.   Our billing staff will not be able to assist you with questions regarding bills from these companies.  You will be contacted with the lab results as soon as they are available. The fastest way to get your results is to activate your My Chart account. Instructions are located on the last page of this paperwork. If you have not heard from [place] regarding the results in 2 weeks, please contact this office.     Acute Bronchitis, Adult Acute bronchitis is sudden (acute) swelling of the air tubes (bronchi) in the lungs. Acute bronchitis causes these tubes to fill with mucus, which can make it hard to breathe. It can also cause coughing or wheezing. In adults, acute bronchitis usually goes away within 2  weeks. A cough caused by bronchitis may last up to 3 weeks. Smoking, allergies, and asthma can make the condition worse. Repeated episodes of bronchitis may cause further lung problems, such as chronic obstructive pulmonary disease (COPD). What are the causes? This condition can be caused by germs and by substances that irritate the lungs, including:  Cold and flu viruses. This condition is most often caused by the same virus that causes a cold.  Bacteria.  Exposure to tobacco smoke, dust, fumes, and air pollution.  What increases the risk? This condition is more likely to develop in people who:  Have close contact with someone with acute bronchitis.  Are exposed to lung irritants, such as tobacco smoke, dust, fumes, and vapors.  Have a weak immune system.  Have a respiratory condition such as asthma.  What are the signs or symptoms? Symptoms of this condition include:  A cough.  Coughing up clear, yellow, or green mucus.  Wheezing.  Chest congestion.  Shortness of breath.  A fever.  Body aches.  Chills.  A sore throat.  How is this diagnosed? This condition is usually diagnosed with a physical exam. During the exam, your health care provider may order tests, such as chest X-rays, to rule out other conditions. He or she may also:  Test a sample of your mucus for bacterial infection.  Check the level of oxygen in your blood. This is done to check for pneumonia.  Do a chest  X-ray or lung function testing to rule out pneumonia and other conditions.  Perform blood tests.  Your health care provider will also ask about your symptoms and medical history. How is this treated? Most cases of acute bronchitis clear up over time without treatment. Your health care provider may recommend:  Drinking more fluids. Drinking more makes your mucus thinner, which may make it easier to breathe.  Taking a medicine for a fever or cough.  Taking an antibiotic medicine.  Using  an inhaler to help improve shortness of breath and to control a cough.  Using a cool mist vaporizer or humidifier to make it easier to breathe.  Follow these instructions at home: Medicines  Take over-the-counter and prescription medicines only as told by your health care provider.  If you were prescribed an antibiotic, take it as told by your health care provider. Do not stop taking the antibiotic even if you start to feel better. General instructions  Get plenty of rest.  Drink enough fluids to keep your urine clear or pale yellow.  Avoid smoking and secondhand smoke. Exposure to cigarette smoke or irritating chemicals will make bronchitis worse. If you smoke and you need help quitting, ask your health care provider. Quitting smoking will help your lungs heal faster.  Use an inhaler, cool mist vaporizer, or humidifier as told by your health care provider.  Keep all follow-up visits as told by your health care provider. This is important. How is this prevented? To lower your risk of getting this condition again:  Wash your hands often with soap and water. If soap and water are not available, use hand sanitizer.  Avoid contact with people who have cold symptoms.  Try not to touch your hands to your mouth, nose, or eyes.  Make sure to get the flu shot every year.  Contact a health care provider if:  Your symptoms do not improve in 2 weeks of treatment. Get help right away if:  You cough up blood.  You have chest pain.  You have severe shortness of breath.  You become dehydrated.  You faint or keep feeling like you are going to faint.  You keep vomiting.  You have a severe headache.  Your fever or chills gets worse. This information is not intended to replace advice given to you by your health care provider. Make sure you discuss any questions you have with your health care provider. Document Released: 08/06/2004 Document Revised: 01/22/2016 Document Reviewed:  12/18/2015 Elsevier Interactive Patient Education  2018 [employer].  Dehydration, Adult Dehydration is a condition in which there is not enough fluid or water in the body. This happens when you lose more fluids than you take in. Important organs, such as the kidneys, brain, and heart, cannot function without a proper amount of fluids. Any loss of fluids from the body can lead to dehydration. Dehydration can range from mild to severe. This condition should be treated right away to prevent it from becoming severe. What are the causes? This condition may be caused by:  Vomiting.  Diarrhea.  Excessive sweating, such as from heat exposure or exercise.  Not drinking enough fluid, especially: ? When ill. ? While doing activity that requires a lot of energy.  Excessive urination.  Fever.  Infection.  Certain medicines, such as medicines that cause the body to lose excess fluid (diuretics).  Inability to access safe drinking water.  Reduced physical ability to get adequate water and food.  What increases the risk? This  condition is more likely to develop in people:  Who have a poorly controlled long-term (chronic) illness, such as diabetes, heart disease, or kidney disease.  Who are age 51 or older.  Who are disabled.  Who live in a place with high altitude.  Who play endurance sports.  What are the signs or symptoms? Symptoms of mild dehydration may include:  Thirst.  Dry lips.  Slightly dry mouth.  Dry, warm skin.  Dizziness. Symptoms of moderate dehydration may include:  Very dry mouth.  Muscle cramps.  Dark urine. Urine may be the color of tea.  Decreased urine production.  Decreased tear production.  Heartbeat that is irregular or faster than normal (palpitations).  Headache.  Light-headedness, especially when you stand up from a sitting position.  Fainting (syncope). Symptoms of severe dehydration may include:  Changes in skin, such  as: ? Cold and clammy skin. ? Blotchy (mottled) or pale skin. ? Skin that does not quickly return to normal after being lightly pinched and released (poor skin turgor).  Changes in body fluids, such as: ? Extreme thirst. ? No tear production. ? Inability to sweat when body temperature is high, such as in hot weather. ? Very little urine production.  Changes in vital signs, such as: ? Weak pulse. ? Pulse that is more than 100 beats a minute when sitting still. ? Rapid breathing. ? Low blood pressure.  Other changes, such as: ? Sunken eyes. ? Cold hands and feet. ? Confusion. ? Lack of energy (lethargy). ? Difficulty waking up from sleep. ? Short-term weight loss. ? Unconsciousness. How is this diagnosed? This condition is diagnosed based on your symptoms and a physical exam. Blood and urine tests may be done to help confirm the diagnosis. How is this treated? Treatment for this condition depends on the severity. Mild or moderate dehydration can often be treated at home. Treatment should be started right away. Do not wait until dehydration becomes severe. Severe dehydration is an emergency and it needs to be treated in a hospital. Treatment for mild dehydration may include:  Drinking more fluids.  Replacing salts and minerals in your blood (electrolytes) that you may have lost. Treatment for moderate dehydration may include:  Drinking an oral rehydration solution (ORS). This is a drink that helps you replace fluids and electrolytes (rehydrate). It can be found at pharmacies and retail stores. Treatment for severe dehydration may include:  Receiving fluids through an IV tube.  Receiving an electrolyte solution through a feeding tube that is passed through your nose and into your stomach (nasogastric tube, or NG tube).  Correcting any abnormalities in electrolytes.  Treating the underlying cause of dehydration. Follow these instructions at home:  If directed by your health  care provider, drink an ORS: ? Make an ORS by following instructions on the package. ? Start by drinking small amounts, about  cup (120 mL) every 5-10 minutes. ? Slowly increase how much you drink until you have taken the amount recommended by your health care provider.  Drink enough clear fluid to keep your urine clear or pale yellow. If you were told to drink an ORS, finish the ORS first, then start slowly drinking other clear fluids. Drink fluids such as: ? Water. Do not drink only water. Doing that can lead to having too little salt (sodium) in the body (hyponatremia). ? Ice chips. ? Fruit juice that you have added water to (diluted fruit juice). ? Low-calorie sports drinks.  Avoid: ? Alcohol. ? Drinks that  contain a lot of sugar. These include high-calorie sports drinks, fruit juice that is not diluted, and soda. ? Caffeine. ? Foods that are greasy or contain a lot of fat or sugar.  Take over-the-counter and prescription medicines only as told by your health care provider.  Do not take sodium tablets. This can lead to having too much sodium in the body (hypernatremia).  Eat foods that contain a healthy balance of electrolytes, such as bananas, oranges, potatoes, tomatoes, and spinach.  Keep all follow-up visits as told by your health care provider. This is important. Contact a health care provider if:  You have abdominal pain that: ? Gets worse. ? Stays in one area (localizes).  You have a rash.  You have a stiff neck.  You are more irritable than usual.  You are sleepier or more difficult to wake up than usual.  You feel weak or dizzy.  You feel very thirsty.  You have urinated only a small amount of very dark urine over 6-8 hours. Get help right away if:  You have symptoms of severe dehydration.  You cannot drink fluids without vomiting.  Your symptoms get worse with treatment.  You have a fever.  You have a severe headache.  You have vomiting or  diarrhea that: ? Gets worse. ? Does not go away.  You have blood or green matter (bile) in your vomit.  You have blood in your stool. This may cause stool to look black and tarry.  You have not urinated in 6-8 hours.  You faint.  Your heart rate while sitting still is over 100 beats a minute.  You have trouble breathing. This information is not intended to replace advice given to you by your health care provider. Make sure you discuss any questions you have with your health care provider. Document Released: 06/29/2005 Document Revised: 01/24/2016 Document Reviewed: 08/23/2015 Elsevier Interactive Patient Education  [employer].

## 2017-11-09 LAB — COMPREHENSIVE METABOLIC PANEL
ALBUMIN: 3.9 g/dL (ref 3.5–5.5)
ALT: 15 IU/L (ref 0–44)
AST: 15 IU/L (ref 0–40)
Albumin/Globulin Ratio: 1.1 — ABNORMAL LOW (ref 1.2–2.2)
Alkaline Phosphatase: 103 IU/L (ref 39–117)
BILIRUBIN TOTAL: 0.6 mg/dL (ref 0.0–1.2)
BUN / CREAT RATIO: 14 (ref 9–20)
BUN: 14 mg/dL (ref 6–24)
CHLORIDE: 97 mmol/L (ref 96–106)
CO2: 24 mmol/L (ref 20–29)
CREATININE: 1.01 mg/dL (ref 0.76–1.27)
Calcium: 9.2 mg/dL (ref 8.7–10.2)
GFR calc non Af Amer: 88 mL/min/{1.73_m2} (ref >59)
GFR, EST AFRICAN AMERICAN: 102 mL/min/{1.73_m2} (ref >59)
GLUCOSE: 178 mg/dL — AB (ref 65–99)
Globulin, Total: 3.5 g/dL (ref 1.5–4.5)
Potassium: 4.4 mmol/L (ref 3.5–5.2)
Sodium: 138 mmol/L (ref 134–144)
TOTAL PROTEIN: 7.4 g/dL (ref 6.0–8.5)

## 2017-11-10 DIAGNOSIS — R7303 Prediabetes: Secondary | ICD-10-CM | POA: Diagnosis not present

## 2017-11-10 DIAGNOSIS — R739 Hyperglycemia, unspecified: Secondary | ICD-10-CM | POA: Diagnosis not present

## 2017-11-10 NOTE — Addendum Note (Signed)
Addended by: Garfield Cornea L on: 11/10/2017 03:04 PM   Modules accepted: Orders

## 2017-11-11 LAB — HEMOGLOBIN A1C
ESTIMATED AVERAGE GLUCOSE: 134 mg/dL
Hgb A1c MFr Bld: 6.3 % — ABNORMAL HIGH (ref 4.8–5.6)

## 2017-11-28 ENCOUNTER — Other Ambulatory Visit: Payer: Self-pay | Admitting: Urgent Care

## 2017-11-28 DIAGNOSIS — I10 Essential (primary) hypertension: Secondary | ICD-10-CM

## 2017-11-29 ENCOUNTER — Other Ambulatory Visit: Payer: Self-pay

## 2017-11-29 DIAGNOSIS — I10 Essential (primary) hypertension: Secondary | ICD-10-CM

## 2017-11-29 MED ORDER — LOSARTAN POTASSIUM 50 MG PO TABS: ORAL_TABLET | ORAL | 0 refills | Status: DC

## 2017-11-29 NOTE — Telephone Encounter (Signed)
Called pt and asked to make appt. Left message on voice mail.Filling for 15 days. losartan refill Last OV: 02/10/17 Last Refill:11/01/17 #30 tabs No RF Pharmacy:[facility] [address] [place]is Bamberg PA-C

## 2017-12-02 ENCOUNTER — Other Ambulatory Visit: Payer: Self-pay | Admitting: Urgent Care

## 2017-12-02 DIAGNOSIS — I10 Essential (primary) hypertension: Secondary | ICD-10-CM

## 2017-12-26 ENCOUNTER — Other Ambulatory Visit: Payer: Self-pay | Admitting: Urgent Care

## 2017-12-26 DIAGNOSIS — I10 Essential (primary) hypertension: Secondary | ICD-10-CM

## 2017-12-30 ENCOUNTER — Other Ambulatory Visit: Payer: Self-pay | Admitting: Urgent Care

## 2017-12-30 DIAGNOSIS — I10 Essential (primary) hypertension: Secondary | ICD-10-CM

## 2018-01-06 ENCOUNTER — Ambulatory Visit (INDEPENDENT_AMBULATORY_CARE_PROVIDER_SITE_OTHER): Payer: BLUE CROSS/BLUE SHIELD | Admitting: Urgent Care

## 2018-01-06 ENCOUNTER — Other Ambulatory Visit: Payer: Self-pay

## 2018-01-06 ENCOUNTER — Encounter: Payer: Self-pay | Admitting: Urgent Care

## 2018-01-06 VITALS — HR 83 | Temp 98.9°F | Resp 16 | Ht 67.25 in | Wt 170.6 lb

## 2018-01-06 DIAGNOSIS — I10 Essential (primary) hypertension: Secondary | ICD-10-CM | POA: Diagnosis not present

## 2018-01-06 DIAGNOSIS — R030 Elevated blood-pressure reading, without diagnosis of hypertension: Secondary | ICD-10-CM | POA: Diagnosis not present

## 2018-01-06 DIAGNOSIS — S335XXA Sprain of ligaments of lumbar spine, initial encounter: Secondary | ICD-10-CM

## 2018-01-06 DIAGNOSIS — M545 Low back pain, unspecified: Secondary | ICD-10-CM

## 2018-01-06 LAB — POCT URINALYSIS DIP (MANUAL ENTRY)
BILIRUBIN UA: NEGATIVE mg/dL
Bilirubin, UA: NEGATIVE
Blood, UA: NEGATIVE
GLUCOSE UA: NEGATIVE mg/dL
LEUKOCYTES UA: NEGATIVE
Nitrite, UA: NEGATIVE
PH UA: 5 (ref 5.0–8.0)
Protein Ur, POC: NEGATIVE mg/dL
Spec Grav, UA: >=1.03 — AB (ref 1.010–1.025)
Urobilinogen, UA: 0.2 E.U./dL

## 2018-01-06 MED ORDER — MELOXICAM 15 MG PO TABS: 7.5000 mg | ORAL_TABLET | Freq: Every day | ORAL | 0 refills | Status: DC

## 2018-01-06 MED ORDER — CYCLOBENZAPRINE HCL 5 MG PO TABS: 5.0000 mg | ORAL_TABLET | Freq: Every day | ORAL | 1 refills | Status: DC

## 2018-01-06 MED ORDER — LISINOPRIL 20 MG PO TABS: 20.0000 mg | ORAL_TABLET | Freq: Every day | ORAL | 1 refills | Status: DC

## 2018-01-06 NOTE — Patient Instructions (Addendum)
Hydrate well with at least 2 liters (1 gallon) of water daily. Use meloxicam 1/2-1 tablet per day for your back pain. Use Flexeril at bedtime for muscle relaxing.    Low Back Sprain A sprain is a stretch or tear in the bands of tissue that hold bones and joints together (ligaments). Sprains of the lower back (lumbar spine) are a common cause of low back pain. A sprain occurs when ligaments are overextended or stretched beyond their limits. The ligaments can become inflamed, resulting in pain and sudden muscle tightening (spasms). A sprain can be caused by an injury (trauma), or it can develop gradually due to overuse. There are three types of sprains:  Grade 1 is a mild sprain involving an overstretched ligament or a very slight tear of the ligament.  Grade 2 is a moderate sprain involving a partial tear of the ligament.  Grade 3 is a severe sprain involving a complete tear of the ligament.  What are the causes? This condition may be caused by:  Trauma, such as a fall or a hit to the body.  Twisting or overstretching the back. This may result from doing activities that require a lot of energy, such as lifting heavy objects.  What increases the risk? The following factors may increase your risk of getting this condition:  Playing contact sports.  Participating in sports or activities that put excessive stress on the back and require a lot of bending and twisting, including: ? Lifting weights or heavy objects. ? Gymnastics. ? Soccer. ? Figure skating. ? Snowboarding.  Being overweight or obese.  Having poor strength and flexibility.  What are the signs or symptoms? Symptoms of this condition may include:  Sharp or dull pain in the lower back that does not go away. Pain may extend to the buttocks.  Stiffness.  Limited range of motion.  Inability to stand up straight due to stiffness or pain.  Muscle spasms.  How is this diagnosed?  This condition may be diagnosed  based on:  Your symptoms.  Your medical history.  A physical exam. ? Your health care provider may push on certain areas of your back to determine the source of your pain. ? You may be asked to bend forward, backward, and side to side to assess the severity of your pain and your range of motion.  Imaging tests, such as: ? X-rays. ? MRI.  How is this treated? Treatment for this condition may include:  Applying heat and cold to the affected area.  Medicines to help relieve pain and to relax your muscles (muscle relaxants).  NSAIDs to help reduce swelling and discomfort.  Physical therapy.  When your symptoms improve, it is important to gradually return to your normal routine as soon as possible to reduce pain, avoid stiffness, and avoid loss of muscle strength. Generally, symptoms should improve within 6 weeks of treatment. However, recovery time varies. Follow these instructions at home: Managing pain, stiffness, and swelling  If directed, apply ice to the injured area during the first 24 hours after your injury. ? Put ice in a plastic bag. ? Place a towel between your skin and the bag. ? Leave the ice on for 20 minutes, 2-3 times a day.  If directed, apply heat to the affected area as often as told by your health care provider. Use the heat source that your health care provider recommends, such as a moist heat pack or a heating pad. ? Place a towel between your skin and  the heat source. ? Leave the heat on for 20-30 minutes. ? Remove the heat if your skin turns bright red. This is especially important if you are unable to feel pain, heat, or cold. You may have a greater risk of getting burned. Activity  Rest and return to your normal activities as told by your health care provider. Ask your health care provider what activities are safe for you.  Avoid activities that take a lot of effort (are strenuous) for as long as told by your health care provider.  Do exercises as  told by your health care provider. General instructions   Take over-the-counter and prescription medicines only as told by your health care provider.  If you have questions or concerns about safety while taking pain medicine, talk with your health care provider.  Do not drive or operate heavy machinery until you know how your pain medicine affects you.  Do not use any tobacco products, such as cigarettes, chewing tobacco, and e-cigarettes. Tobacco can delay bone healing. If you need help quitting, ask your health care provider.  Keep all follow-up visits as told by your health care provider. This is important. How is this prevented?  Warm up and stretch before being active.  Cool down and stretch after being active.  Give your body time to rest between periods of activity.  Avoid: ? Being physically inactive for long periods at a time. ? Exercising or playing sports when you are tired or in pain.  Use correct form when playing sports and lifting heavy objects.  Use good posture when sitting and standing.  Maintain a healthy weight.  Sleep on a mattress with medium firmness to support your back.  Make sure to use equipment that fits you, including shoes that fit well.  Be safe and responsible while being active to avoid falls.  Do at least 150 minutes of moderate-intensity exercise each week, such as brisk walking or water aerobics. Try a form of exercise that takes stress off your back, such as swimming or stationary cycling.  Maintain physical fitness, including: ? Strength. In particular, develop and maintain strong abdominal muscles. ? Flexibility. ? Cardiovascular fitness. ? Endurance. Contact a health care provider if:  Your back pain does not improve after 6 weeks of treatment.  Your symptoms get worse. Get help right away if:  Your back pain is severe.  You are unable to stand or walk.  You develop pain in your legs.  You develop weakness in your  buttocks or legs.  You have difficulty controlling when you urinate or when you have a bowel movement. This information is not intended to replace advice given to you by your health care provider. Make sure you discuss any questions you have with your health care provider. Document Released: 06/29/2005 Document Revised: 03/05/2016 Document Reviewed: 04/10/2015 Elsevier Interactive Patient Education  2018 [employer]     IF you received an x-ray today, you will receive an invoice from [facility]. Please contact [facility] at [phone] with questions or concerns regarding your invoice.   IF you received labwork today, you will receive an invoice from [facility]. Please contact [facility] at [phone] with questions or concerns regarding your invoice.   Our billing staff will not be able to assist you with questions regarding bills from these companies.  You will be contacted with the lab results as soon as they are available. The fastest way to get your results is to activate your My Chart account. Instructions are  located on the last page of this paperwork. If you have not heard from [place] regarding the results in 2 weeks, please contact this office.

## 2018-01-06 NOTE — Progress Notes (Signed)
    MRN: [id number] DOB: Sep 28, 1970  Subjective:   Eric Pham is a 47 y.o. male presenting for 4-day history of mid-left sided low back pain.  Patient states that he slipped and nearly fell but twisted his back and caught himself up against the room.  He has since had pain, difficulty with walking, standing up straight, bending.  Patient has been unable to work due to his pain.  He has not tried any medications for relief.  Reports that he does not drink water.  Is also requesting a refill of his blood pressure medication losartan.  Denies headaches, dizziness, chest pain, belly pain, hematuria.  Also denies dysuria, urinary frequency, weakness, numbness or tingling.  Eric Pham has a current medication list which includes the following prescription(s): losartan. Also has No Known Allergies.  Eric Pham  has a past medical history of Allergy, Epistaxis, Hemorrhoids, and Hypertension. Also  has a past surgical history that includes Hernia repair (Left, 2009).  Objective:   Vitals: Pulse 83   Temp 98.9 F (37.2 C) (Oral)   Resp 16   Ht 5' 7.25" (1.708 m)   Wt 170 lb 9.6 oz (77.4 kg)   SpO2 99%   BMI 26.52 kg/m   Physical Exam  Constitutional: He is oriented to person, place, and time. He appears well-developed and well-nourished.  Cardiovascular: Normal rate, regular rhythm and intact distal pulses. Exam reveals no gallop and no friction rub.  No murmur heard. Pulmonary/Chest: Effort normal. No respiratory distress. He has no wheezes. He has no rales.  Musculoskeletal:       Lumbar back: He exhibits decreased range of motion, tenderness (over area depicted) and spasm. He exhibits no bony tenderness, no swelling, no edema and no deformity.       Back:  Neurological: He is alert and oriented to person, place, and time. He displays normal reflexes. Coordination (unable to sit up straight, positive SLR to the right) abnormal.  Skin: Skin is warm and dry.   Results for orders placed or  performed in visit on 01/06/18 (from the past 24 hour(s))  POCT urinalysis dipstick     Status: Abnormal   Collection Time: 01/06/18 12:04 PM  Result Value Ref Range   Color, UA yellow yellow   Clarity, UA clear clear   Glucose, UA negative negative mg/dL   Bilirubin, UA negative negative   Ketones, POC UA negative negative mg/dL   Spec Grav, UA >=9.629>=1.030 (A) 1.010 - 1.025   Blood, UA negative negative   pH, UA 5.0 5.0 - 8.0   Protein Ur, POC negative negative mg/dL   Urobilinogen, UA 0.2 0.2 or 1.0 E.U./dL   Nitrite, UA Negative Negative   Leukocytes, UA Negative Negative   Assessment and Plan :   Acute left-sided low back pain without sciatica - Plan: POCT urinalysis dipstick  Sprain of low back, initial encounter  Essential hypertension, benign  Elevated blood pressure reading  Patient is to stop taking losartan, start lisinopril.  For his back we will try meloxicam at 15 mg, Flexeril 5 mg at bedtime.  Recommended patient start hydrating with water.  Follow-up in 1 to 2 weeks if his back pain persists, consider steroid course at that point.  Monitoring parameters for his blood pressure were reviewed, follow-up in 4 weeks for this.  Wallis BambergMario Dezmin Kittelson, PA-C [facility] [phone] 01/06/2018  12:16 PM

## 2018-02-02 ENCOUNTER — Other Ambulatory Visit: Payer: Self-pay | Admitting: Urgent Care

## 2018-02-04 ENCOUNTER — Ambulatory Visit: Payer: BLUE CROSS/BLUE SHIELD | Admitting: Urgent Care

## 2018-02-07 ENCOUNTER — Encounter: Payer: Self-pay | Admitting: Urgent Care

## 2018-02-07 ENCOUNTER — Ambulatory Visit (INDEPENDENT_AMBULATORY_CARE_PROVIDER_SITE_OTHER): Payer: BLUE CROSS/BLUE SHIELD | Admitting: Urgent Care

## 2018-02-07 VITALS — BP 145/82 | HR 69 | Temp 98.2°F | Resp 16 | Ht 67.25 in | Wt 173.4 lb

## 2018-02-07 DIAGNOSIS — I10 Essential (primary) hypertension: Secondary | ICD-10-CM | POA: Diagnosis not present

## 2018-02-07 DIAGNOSIS — R030 Elevated blood-pressure reading, without diagnosis of hypertension: Secondary | ICD-10-CM

## 2018-02-07 MED ORDER — LISINOPRIL-HYDROCHLOROTHIAZIDE 20-12.5 MG PO TABS: 1.0000 | ORAL_TABLET | Freq: Every day | ORAL | 1 refills | Status: DC

## 2018-02-07 NOTE — Patient Instructions (Addendum)
Please check your blood pressure once weekly. Try to check the blood pressure around the same time each day that you check it after a period of resting in a seated position for 5-10 minutes. The readings should be between 110-130 systolic (top number), between 70-90 diastolic (bottom number). If your blood pressure falls outside this range consistently (i.e. 3-4 weeks in a row), then send me a message through mychart so we can make changes to your medication dosing.    Managing Your Hypertension Hypertension is commonly called high blood pressure. This is when the force of your blood pressing against the walls of your arteries is too strong. Arteries are blood vessels that carry blood from your heart throughout your body. Hypertension forces the heart to work harder to pump blood, and may cause the arteries to become narrow or stiff. Having untreated or uncontrolled hypertension can cause heart attack, stroke, kidney disease, and other problems. What are blood pressure readings? A blood pressure reading consists of a higher number over a lower number. Ideally, your blood pressure should be below 120/80. The first ("top") number is called the systolic pressure. It is a measure of the pressure in your arteries as your heart beats. The second ("bottom") number is called the diastolic pressure. It is a measure of the pressure in your arteries as the heart relaxes. What does my blood pressure reading mean? Blood pressure is classified into four stages. Based on your blood pressure reading, your health care provider may use the following stages to determine what type of treatment you need, if any. Systolic pressure and diastolic pressure are measured in a unit called mm Hg. Normal  Systolic pressure: below 120.  Diastolic pressure: below 80. Elevated  Systolic pressure: 120-129.  Diastolic pressure: below 80. Hypertension stage 1  Systolic pressure: 130-139.  Diastolic pressure:  80-89. Hypertension stage 2  Systolic pressure: 140 or above.  Diastolic pressure: 90 or above. What health risks are associated with hypertension? Managing your hypertension is an important responsibility. Uncontrolled hypertension can lead to:  A heart attack.  A stroke.  A weakened blood vessel (aneurysm).  Heart failure.  Kidney damage.  Eye damage.  Metabolic syndrome.  Memory and concentration problems.  What changes can I make to manage my hypertension? Hypertension can be managed by making lifestyle changes and possibly by taking medicines. Your health care provider will help you make a plan to bring your blood pressure within a normal range. Eating and drinking  Eat a diet that is high in fiber and potassium, and low in salt (sodium), added sugar, and fat. An example eating plan is called the DASH (Dietary Approaches to Stop Hypertension) diet. To eat this way: ? Eat plenty of fresh fruits and vegetables. Try to fill half of your plate at each meal with fruits and vegetables. ? Eat whole grains, such as whole wheat pasta, brown rice, or whole grain bread. Fill about one quarter of your plate with whole grains. ? Eat low-fat diary products. ? Avoid fatty cuts of meat, processed or cured meats, and poultry with skin. Fill about one quarter of your plate with lean proteins such as fish, chicken without skin, beans, eggs, and tofu. ? Avoid premade and processed foods. These tend to be higher in sodium, added sugar, and fat.  Reduce your daily sodium intake. Most people with hypertension should eat less than 1,500 mg of sodium a day.  Limit alcohol intake to no more than 1 drink a day for nonpregnant  women and 2 drinks a day for men. One drink equals 12 oz of beer, 5 oz of wine, or 1 oz of hard liquor. Lifestyle  Work with your health care provider to maintain a healthy body weight, or to lose weight. Ask what an ideal weight is for you.  Get at least 30 minutes of  exercise that causes your heart to beat faster (aerobic exercise) most days of the week. Activities may include walking, swimming, or biking.  Include exercise to strengthen your muscles (resistance exercise), such as weight lifting, as part of your weekly exercise routine. Try to do these types of exercises for 30 minutes at least 3 days a week.  Do not use any products that contain nicotine or tobacco, such as cigarettes and e-cigarettes. If you need help quitting, ask your health care provider.  Control any long-term (chronic) conditions you have, such as high cholesterol or diabetes. Monitoring  Monitor your blood pressure at home as told by your health care provider. Your personal target blood pressure may vary depending on your medical conditions, your age, and other factors.  Have your blood pressure checked regularly, as often as told by your health care provider. Working with your health care provider  Review all the medicines you take with your health care provider because there may be side effects or interactions.  Talk with your health care provider about your diet, exercise habits, and other lifestyle factors that may be contributing to hypertension.  Visit your health care provider regularly. Your health care provider can help you create and adjust your plan for managing hypertension. Will I need medicine to control my blood pressure? Your health care provider may prescribe medicine if lifestyle changes are not enough to get your blood pressure under control, and if:  Your systolic blood pressure is 130 or higher.  Your diastolic blood pressure is 80 or higher.  Take medicines only as told by your health care provider. Follow the directions carefully. Blood pressure medicines must be taken as prescribed. The medicine does not work as well when you skip doses. Skipping doses also puts you at risk for problems. Contact a health care provider if:  You think you are having a  reaction to medicines you have taken.  You have repeated (recurrent) headaches.  You feel dizzy.  You have swelling in your ankles.  You have trouble with your vision. Get help right away if:  You develop a severe headache or confusion.  You have unusual weakness or numbness, or you feel faint.  You have severe pain in your chest or abdomen.  You vomit repeatedly.  You have trouble breathing. Summary  Hypertension is when the force of blood pumping through your arteries is too strong. If this condition is not controlled, it may put you at risk for serious complications.  Your personal target blood pressure may vary depending on your medical conditions, your age, and other factors. For most people, a normal blood pressure is less than 120/80.  Hypertension is managed by lifestyle changes, medicines, or both. Lifestyle changes include weight loss, eating a healthy, low-sodium diet, exercising more, and limiting alcohol. This information is not intended to replace advice given to you by your health care provider. Make sure you discuss any questions you have with your health care provider. Document Released: 03/23/2012 Document Revised: 05/27/2016 Document Reviewed: 05/27/2016 Elsevier Interactive Patient Education  2018 [employer].    Hydrochlorothiazide, HCTZ; Lisinopril tablets What is this medicine? HYDROCHLOROTHIAZIDE; LISINOPRIL (hye droe  klor oh THYE a zide; lyse IN oh pril) is a combination of a diuretic and an ACE inhibitor. It is used to treat high blood pressure. This medicine may be used for other purposes; ask your health care provider or pharmacist if you have questions. COMMON BRAND NAME(S): Prinzide, Zestoretic What should I tell my health care provider before I take this medicine? They need to know if you have any of these conditions: -bone marrow disease -decreased urine -heart or blood vessel disease -if you are on a special diet like a low salt  diet -immune system problems, like lupus -kidney disease -liver disease -previous swelling of the tongue, face, or lips with difficulty breathing, difficulty swallowing, hoarseness, or tightening of the throat -recent heart attack or stroke -an unusual or allergic reaction to lisinopril, hydrochlorothiazide, sulfa drugs, other medicines, insect venom, foods, dyes, or preservatives -pregnant or trying to get pregnant -breast-feeding How should I use this medicine? Take this medicine by mouth with a glass of water. Follow the directions on the prescription label. You can take it with or without food. If it upsets your stomach, take it with food. Take your medicine at regular intervals. Do not take it more often than directed. Do not stop taking except on your doctor's advice. Talk to your pediatrician regarding the use of this medicine in children. Special care may be needed. Overdosage: If you think you have taken too much of this medicine contact a poison control center or emergency room at once. NOTE: This medicine is only for you. Do not share this medicine with others. What if I miss a dose? If you miss a dose, take it as soon as you can. If it is almost time for your next dose, take only that dose. Do not take double or extra doses. What may interact with this medicine? Do not take this medication with any of the following medications: -sacubitril; valsartan This medicine may also interact with the following: -barbiturates like phenobarbital -blood pressure medicines -corticosteroids like prednisone -diabetic medications -diuretics, especially triamterene, spironolactone or amiloride -lithium -NSAIDs, medicines for pain and inflammation, like ibuprofen or naproxen -potassium salts or potassium supplements -prescription pain medicines -skeletal muscle relaxants like tubocurarine -some cholesterol lowering medications like cholestyramine or colestipol This list may not describe all  possible interactions. Give your health care provider a list of all the medicines, herbs, non-prescription drugs, or dietary supplements you use. Also tell them if you smoke, drink alcohol, or use illegal drugs. Some items may interact with your medicine. What should I watch for while using this medicine? Visit your doctor or health care professional for regular checks on your progress. Check your blood pressure as directed. Ask your doctor or health care professional what your blood pressure should be and when you should contact him or her. Call your doctor or health care professional if you notice an irregular or fast heart beat. You must not get dehydrated. Ask your doctor or health care professional how much fluid you need to drink a day. Check with him or her if you get an attack of severe diarrhea, nausea and vomiting, or if you sweat a lot. The loss of too much body fluid can make it dangerous for you to take this medicine. Women should inform their doctor if they wish to become pregnant or think they might be pregnant. There is a potential for serious side effects to an unborn child. Talk to your health care professional or pharmacist for more information. You may  get drowsy or dizzy. Do not drive, use machinery, or do anything that needs mental alertness until you know how this drug affects you. Do not stand or sit up quickly, especially if you are an older patient. This reduces the risk of dizzy or fainting spells. Alcohol can make you more drowsy and dizzy. Avoid alcoholic drinks. This medicine may affect your blood sugar level. If you have diabetes, check with your doctor or health care professional before changing the dose of your diabetic medicine. Avoid salt substitutes unless you are told otherwise by your doctor or health care professional. This medicine can make you more sensitive to the sun. Keep out of the sun. If you cannot avoid being in the sun, wear protective clothing and use  sunscreen. Do not use sun lamps or tanning beds/booths. Do not treat yourself for coughs, colds, or pain while you are taking this medicine without asking your doctor or health care professional for advice. Some ingredients may increase your blood pressure. What side effects may I notice from receiving this medicine? Side effects that you should report to your doctor or health care professional as soon as possible: -changes in vision -confusion, dizziness, light headedness or fainting spells -decreased amount of urine passed -difficulty breathing or swallowing, hoarseness, or tightening of the throat -eye pain -fast or irregular heart beat, palpitations, or chest pain -muscle cramps -nausea and vomiting -persistent dry cough -redness, blistering, peeling or loosening of the skin, including inside the mouth -stomach pain -swelling of your face, lips, tongue, hands, or feet -unusual rash, bleeding or bruising, or pinpoint red spots on the skin -worsened gout pain -yellowing of the eyes or skin Side effects that usually do not require medical attention (report to your doctor or health care professional if they continue or are bothersome): -change in sex drive or performance -cough -headache This list may not describe all possible side effects. Call your doctor for medical advice about side effects. You may report side effects to FDA at 1-800-FDA-1088. Where should I keep my medicine? Keep out of the reach of children. Store at room temperature between 20 and 25 degrees C (68 and 77 degrees F). Protect from moisture and excessive light. Keep container tightly closed. Throw away any unused medicine after the expiration date. NOTE: This sheet is a summary. It may not cover all possible information. If you have questions about this medicine, talk to your doctor, pharmacist, or health care provider.  2018 Elsevier/Gold Standard (2015-08-23 11:42:20)     IF you received an x-ray today, you  will receive an invoice from [facility]. Please contact [facility] at [phone] with questions or concerns regarding your invoice.   IF you received labwork today, you will receive an invoice from [facility]. Please contact [facility] at [phone] with questions or concerns regarding your invoice.   Our billing staff will not be able to assist you with questions regarding bills from these companies.  You will be contacted with the lab results as soon as they are available. The fastest way to get your results is to activate your My Chart account. Instructions are located on the last page of this paperwork. If you have not heard from [place] regarding the results in 2 weeks, please contact this office.

## 2018-02-07 NOTE — Progress Notes (Signed)
    MRN: [id number] DOB: 1970/08/16  Subjective:   Eric Pham is a 47 y.o. male presenting for follow up on Hypertension. Currently managed with lisinopril 20mg . Patient is checking blood pressure at home, generally 140s systolic. Avoids salt in diet. Denies dizziness, chronic headache, chest pain, shortness of breath, heart racing, palpitations, nausea, vomiting, abdominal pain, hematuria, lower leg swelling. Denies smoking cigarettes or drinking alcohol.   Eric Pham has a current medication list which includes the following prescription(s): lisinopril. Also has No Known Allergies.  Eric Pham  has a past medical history of Allergy, Epistaxis, Hemorrhoids, and Hypertension. Also  has a past surgical history that includes Hernia repair (Left, 2009).  Objective:   Vitals: BP (!) 145/82   Pulse 69   Temp 98.2 F (36.8 C) (Oral)   Resp 16   Ht 5' 7.25" (1.708 m)   Wt 173 lb 6.4 oz (78.7 kg)   SpO2 97%   BMI 26.96 kg/m   Physical Exam  Constitutional: He is oriented to person, place, and time. He appears well-developed and well-nourished.  Cardiovascular: Normal rate.  Pulmonary/Chest: Effort normal.  Neurological: He is alert and oriented to person, place, and time.   Assessment and Plan :   Essential hypertension  Elevated blood pressure reading  We will add hydrochlorothiazide to patient's regimen.  He is to maintain healthy lifestyle.  Blood pressure monitoring parameters provided.  We will follow-up in 3 months or sooner as dictated by his monitoring.  Wallis BambergMario Keishla Oyer, PA-C Primary Care at [facility] [phone] 02/07/2018  11:21 AM

## 2018-02-16 ENCOUNTER — Other Ambulatory Visit: Payer: Self-pay

## 2018-02-16 ENCOUNTER — Ambulatory Visit (INDEPENDENT_AMBULATORY_CARE_PROVIDER_SITE_OTHER): Payer: BLUE CROSS/BLUE SHIELD

## 2018-02-16 ENCOUNTER — Ambulatory Visit (INDEPENDENT_AMBULATORY_CARE_PROVIDER_SITE_OTHER): Payer: BLUE CROSS/BLUE SHIELD | Admitting: Urgent Care

## 2018-02-16 ENCOUNTER — Encounter: Payer: Self-pay | Admitting: Urgent Care

## 2018-02-16 VITALS — BP 127/87 | HR 78 | Temp 97.6°F | Resp 16 | Ht 67.25 in | Wt 172.8 lb

## 2018-02-16 DIAGNOSIS — I10 Essential (primary) hypertension: Secondary | ICD-10-CM | POA: Diagnosis not present

## 2018-02-16 DIAGNOSIS — M7918 Myalgia, other site: Secondary | ICD-10-CM

## 2018-02-16 DIAGNOSIS — M5410 Radiculopathy, site unspecified: Secondary | ICD-10-CM

## 2018-02-16 DIAGNOSIS — M545 Low back pain: Secondary | ICD-10-CM | POA: Diagnosis not present

## 2018-02-16 MED ORDER — PREDNISONE 20 MG PO TABS: ORAL_TABLET | ORAL | 0 refills | Status: DC

## 2018-02-16 MED ORDER — CYCLOBENZAPRINE HCL 5 MG PO TABS: 5.0000 mg | ORAL_TABLET | Freq: Every evening | ORAL | 1 refills | Status: DC | PRN

## 2018-02-16 NOTE — Progress Notes (Signed)
    MRN: [id number] DOB: 1970-08-20  Subjective:   Eric Pham is a 47 y.o. male presenting for 1 to 2-week history of persistent left buttock pain that radiates into his left leg down to his calf.  We have previously help patient with low back pain and he has used meloxicam successfully for this but has not helped him with his buttock and leg pain.  He switched the wallet that he uses and now has it in his front pocket.  However, he reports that he has very difficult time bending and stooping, lifting.  He has also used Flexeril in the past but is out of this now and states that he would like a refill as it is helped him.  Denies falls, trauma, weakness, numbness or tingling.  Denies any urinary issues or issues with his bowel movements.  Eric Pham has a current medication list which includes the following prescription(s): lisinopril-hydrochlorothiazide. Also has No Known Allergies.  Eric Pham  has a past medical history of Allergy, Epistaxis, Hemorrhoids, and Hypertension. Also  has a past surgical history that includes Hernia repair (Left, 2009).  Objective:   Vitals: BP 127/87   Pulse 78   Temp 97.6 F (36.4 C) (Oral)   Resp 16   Ht 5' 7.25" (1.708 m)   Wt 172 lb 12.8 oz (78.4 kg)   SpO2 97%   BMI 26.86 kg/m   BP Readings from Last 3 Encounters:  02/16/18 127/87  02/07/18 (!) 145/82  11/08/17 134/88    Physical Exam  Constitutional: He is oriented to person, place, and time. He appears well-developed and well-nourished.  Cardiovascular: Normal rate.  Pulmonary/Chest: Effort normal.  Musculoskeletal:       Left hip: He exhibits tenderness (over area depicted). He exhibits normal range of motion, normal strength, no bony tenderness, no swelling, no crepitus, no deformity and no laceration.       Left knee: He exhibits normal range of motion, no swelling, no effusion, no ecchymosis, no deformity, no laceration, no erythema, normal alignment and normal patellar mobility. No tenderness  found.       Left upper leg: He exhibits no tenderness, no bony tenderness, no swelling, no edema, no deformity and no laceration.       Legs: Slightly positive straight leg raise to the left.  Neurological: He is alert and oriented to person, place, and time.  Skin: Skin is warm and dry.   Dg Lumbar Spine Complete  Result Date: 02/16/2018 CLINICAL DATA:  persistent left low back/hip pain EXAM: LUMBAR SPINE - COMPLETE 4+ VIEW COMPARISON:  None. FINDINGS: There is no evidence of lumbar spine fracture. Alignment is normal. Intervertebral disc spaces are maintained. IMPRESSION: No acute findings lumbar spine. Electronically Signed   By: Genevive BiStewart  Edmunds M.D.   On: 02/16/2018 11:25   Assessment and Plan :   Radicular pain of left lower extremity - Plan: DG Lumbar Spine Complete  Left buttock pain - Plan: DG Lumbar Spine Complete  Essential hypertension  Radiology report reassuring.  Will use steroid course given the meloxicam is not outpatient.  Refilled his Flexeril.  Patient is to maintain his hydration.  Consider physical therapy referral to Ortho. Counseled patient on potential for adverse effects with medications prescribed today, patient verbalized understanding.   Wallis BambergMario Haylen Bellotti, PA-C Primary Care at [facility] [phone] 02/16/2018  11:07 AM

## 2018-02-16 NOTE — Patient Instructions (Addendum)
Lumbosacral Radiculopathy Lumbosacral radiculopathy is a condition that involves the spinal nerves and nerve roots in the low back and bottom of the spine. The condition develops when these nerves and nerve roots move out of place or become inflamed and cause symptoms. What are the causes? This condition may be caused by:  Pressure from a disk that bulges out of place (herniated disk). A disk is a plate of cartilage that separates bones in the spine.  Disk degeneration.  A narrowing of the bones of the lower back (spinal stenosis).  A tumor.  An infection.  An injury that places sudden pressure on the disks that cushion the bones of your lower spine.  What increases the risk? This condition is more likely to develop in:  Males aged 30-50 years.  Females aged 50-60 years.  People who lift improperly.  People who are overweight or live a sedentary lifestyle.  People who smoke.  People who perform repetitive activities that strain the spine.  What are the signs or symptoms? Symptoms of this condition include:  Pain that goes down from the back into the legs (sciatica). This is the most common symptom. The pain may be worse with sitting, coughing, or sneezing.  Pain and numbness in the arms and legs.  Muscle weakness.  Tingling.  Loss of bladder control or bowel control.  How is this diagnosed? This condition is diagnosed with a physical exam and medical history. If the pain is lasting, you may have tests, such as:  MRI scan.  X-ray.  CT scan.  Myelogram.  Nerve conduction study.  How is this treated? This condition is often treated with:  Hot packs and ice applied to affected areas.  Stretches to improve flexibility.  Exercises to strengthen back muscles.  Physical therapy.  Pain medicine.  A steroid injection in the spine.  In some cases, no treatment is needed. If the condition is long-lasting (chronic), or if symptoms are severe, treatment may  involve surgery or lifestyle changes, such as following a weight loss plan. Follow these instructions at home: Medicines  Take medicines only as directed by your health care provider.  Do not drive or operate heavy machinery while taking pain medicine. Injury care  Apply a heat pack to the injured area as directed by your health care provider.  Apply ice to the affected area: ? Put ice in a plastic bag. ? Place a towel between your skin and the bag. ? Leave the ice on for 20-30 minutes, every 2 hours while you are awake or as needed. Or, leave the ice on for as long as directed by your health care provider. Other Instructions  If you were shown how to do any exercises or stretches, do them as directed by your health care provider.  If your health care provider prescribed a diet or exercise program, follow it as directed.  Keep all follow-up visits as directed by your health care provider. This is important. Contact a health care provider if:  Your pain does not improve over time even when taking pain medicines. Get help right away if:  Your develop severe pain.  Your pain suddenly gets worse.  You develop increasing weakness in your legs.  You lose the ability to control your bladder or bowel.  You have difficulty walking or balancing.  You have a fever. This information is not intended to replace advice given to you by your health care provider. Make sure you discuss any questions you have with your   health care provider. Document Released: 06/29/2005 Document Revised: 12/05/2015 Document Reviewed: 06/25/2014 Elsevier Interactive Patient Education  2018 [employer]     IF you received an x-ray today, you will receive an invoice from [facility]. Please contact [facility] at [phone] with questions or concerns regarding your invoice.   IF you received labwork today, you will receive an invoice from [facility]. Please contact [facility] at  [phone] with questions or concerns regarding your invoice.   Our billing staff will not be able to assist you with questions regarding bills from these companies.  You will be contacted with the lab results as soon as they are available. The fastest way to get your results is to activate your My Chart account. Instructions are located on the last page of this paperwork. If you have not heard from us regarding the results in 2 weeks, please contact this office.

## 2018-02-17 ENCOUNTER — Encounter: Payer: Self-pay | Admitting: Urgent Care

## 2018-05-13 ENCOUNTER — Ambulatory Visit: Payer: BLUE CROSS/BLUE SHIELD | Admitting: Urgent Care

## 2018-07-29 ENCOUNTER — Other Ambulatory Visit: Payer: Self-pay | Admitting: Urgent Care

## 2018-08-20 ENCOUNTER — Other Ambulatory Visit: Payer: Self-pay | Admitting: Urgent Care

## 2018-09-24 ENCOUNTER — Other Ambulatory Visit: Payer: Self-pay | Admitting: Family Medicine

## 2018-09-28 ENCOUNTER — Other Ambulatory Visit: Payer: Self-pay | Admitting: Family Medicine

## 2018-09-30 ENCOUNTER — Ambulatory Visit: Payer: Self-pay | Admitting: Family Medicine

## 2018-10-01 ENCOUNTER — Other Ambulatory Visit: Payer: Self-pay | Admitting: *Deleted

## 2018-10-01 DIAGNOSIS — I10 Essential (primary) hypertension: Secondary | ICD-10-CM

## 2018-10-04 ENCOUNTER — Other Ambulatory Visit: Payer: Self-pay | Admitting: Family Medicine

## 2018-10-04 ENCOUNTER — Ambulatory Visit: Payer: Self-pay | Admitting: Family Medicine

## 2018-10-04 NOTE — Telephone Encounter (Signed)
I called pt and schedule a virtual visit for 10/06/2018 for medication f/u

## 2018-10-06 ENCOUNTER — Other Ambulatory Visit: Payer: Self-pay

## 2018-10-06 ENCOUNTER — Telehealth (INDEPENDENT_AMBULATORY_CARE_PROVIDER_SITE_OTHER): Payer: BLUE CROSS/BLUE SHIELD | Admitting: Family Medicine

## 2018-10-06 DIAGNOSIS — Z131 Encounter for screening for diabetes mellitus: Secondary | ICD-10-CM

## 2018-10-06 DIAGNOSIS — I10 Essential (primary) hypertension: Secondary | ICD-10-CM | POA: Diagnosis not present

## 2018-10-06 MED ORDER — LISINOPRIL-HYDROCHLOROTHIAZIDE 20-12.5 MG PO TABS: 1.0000 | ORAL_TABLET | Freq: Every day | ORAL | 1 refills | Status: DC

## 2018-10-06 NOTE — Progress Notes (Signed)
CC: Medication refill- lisinopril-hctz 20-12.5 mg.  Per pt he has been out of medication x 2 weeks.

## 2018-10-06 NOTE — Progress Notes (Signed)
Telemedicine Encounter- SOAP NOTE Established Patient  This telephone encounter was conducted with the patient's (or proxy's) verbal consent via audio telecommunications: yes/no: Yes Patient was instructed to have this encounter in a suitably private space; and to only have persons present to whom they give permission to participate. In addition, patient identity was confirmed by use of name plus two identifiers (DOB and address).  I discussed the limitations, risks, security and privacy concerns of performing an evaluation and management service by telephone and the availability of in person appointments. I also discussed with the patient that there may be a patient responsible charge related to this service. The patient expressed understanding and agreed to proceed.  I spent a total of TIME; 0 MIN TO 60 MIN: 15 minutes talking with the patient or their proxy.  CC: hypertension  Subjective   Eric Pham is a 48 y.o. established patient. Telephone visit today for  HPI  Patient reports that he checks his bp 147/93  He has been out of his medications for 2 weeks He states that he gets intermittent headaches that last 30 minutes. He works second shift. He denies vision changes He denies chest pains He reports that when he was on the medications he did not have headaches He drinks coffee intermittently He exercises 30 minutes a day BP Readings from Last 3 Encounters:  02/16/18 127/87  02/07/18 (!) 145/82  11/08/17 134/88   Lab Results  Component Value Date   CHOL 173 07/07/2016   CHOL 158 02/08/2015   CHOL 166 06/16/2013   Lab Results  Component Value Date   HDL 24 (L) 07/07/2016   HDL 24 (L) 02/08/2015   HDL 31 (L) 06/16/2013   Lab Results  Component Value Date   LDLCALC 94 07/07/2016   LDLCALC 99 02/08/2015   LDLCALC 113 (H) 06/16/2013   Lab Results  Component Value Date   TRIG 276 (H) 07/07/2016   TRIG 174 (H) 02/08/2015   TRIG 108 06/16/2013   Lab Results   Component Value Date   CHOLHDL 7.2 (H) 07/07/2016   CHOLHDL 6.6 (H) 02/08/2015   CHOLHDL 5.4 06/16/2013   No results found for: LDLDIRECT   Patient Active Problem List   Diagnosis Date Noted  . Epistaxis 07/08/2015  . Hemorrhoids 07/08/2015  . Inguinal hernia 07/08/2015  . BMI 27.0-27.9,adult 07/08/2015  . Essential hypertension 03/08/2015  . Low HDL (under 40) 03/08/2015    Past Medical History:  Diagnosis Date  . Allergy   . Epistaxis   . Hemorrhoids   . Hypertension     Current Outpatient Medications  Medication Sig Dispense Refill  . lisinopril-hydrochlorothiazide (PRINZIDE,ZESTORETIC) 20-12.5 MG tablet Take 1 tablet by mouth daily. 90 tablet 1   No current facility-administered medications for this visit.     No Known Allergies  Social History   Socioeconomic History  . Marital status: Married    Spouse name: Ranya  . Number of children: 1  . Years of education: [employer] Sch[employer]  . Highest education level: Not on file  Occupational History  . Occupation: Information systems manager: [employer]  Social Needs  . Financial resource strain: Not on file  . Food insecurity:    Worry: Not on file    Inability: Not on file  . Transportation needs:    Medical: Not on file    Non-medical: Not on file  Tobacco Use  . Smoking status: Current Some Day Smoker  . Smokeless tobacco: Former  User  Substance and Sexual Activity  . Alcohol use: No    Alcohol/week: 0.0 standard drinks  . Drug use: No  . Sexual activity: Yes    Partners: Female  Lifestyle  . Physical activity:    Days per week: Not on file    Minutes per session: Not on file  . Stress: Not on file  Relationships  . Social connections:    Talks on phone: Not on file    Gets together: Not on file    Attends religious service: Not on file    Active member of club or organization: Not on file    Attends meetings of clubs or organizations: Not on file    Relationship status: Not on file  .  Intimate partner violence:    Fear of current or ex partner: Not on file    Emotionally abused: Not on file    Physically abused: Not on file    Forced sexual activity: Not on file  Other Topics Concern  . Not on file  Social History Narrative   From [place]. Came to the [place] in 2009.   Lives with 3 friends.   His wife and son live in [place].    ROS  Objective   Vitals as reported by the patient: 147/93 There were no vitals filed for this visit.  Diagnoses and all orders for this visit:  Essential hypertension -     lisinopril-hydrochlorothiazide (PRINZIDE,ZESTORETIC) 20-12.5 MG tablet; Take 1 tablet by mouth daily.   Will refill the bp medications Previously well controlled Explained the importance of monitoring his kidneys while on this medication as well as screening for other disorders like diabetes  Screening for diabetes mellitus - will screen with a1c  I discussed the assessment and treatment plan with the patient. The patient was provided an opportunity to ask questions and all were answered. The patient agreed with the plan and demonstrated an understanding of the instructions.   The patient was advised to call back or seek an in-person evaluation if the symptoms worsen or if the condition fails to improve as anticipated.  I provided 15 minutes of non-face-to-face time during this encounter.  Doristine Bosworth, MD  Primary Care at [facility]

## 2018-10-06 NOTE — Patient Instructions (Signed)
° ° ° °  If you have lab work done today you will be contacted with your lab results within the next 2 weeks.  If you have not heard from us then please contact us. The fastest way to get your results is to register for My Chart. ° ° °IF you received an x-ray today, you will receive an invoice from [facility]. Please contact [facility] at [phone] with questions or concerns regarding your invoice.  ° °IF you received labwork today, you will receive an invoice from [facility]. Please contact [facility] at [phone] with questions or concerns regarding your invoice.  ° °Our billing staff will not be able to assist you with questions regarding bills from these companies. ° °You will be contacted with the lab results as soon as they are available. The fastest way to get your results is to activate your My Chart account. Instructions are located on the last page of this paperwork. If you have not heard from us regarding the results in 2 weeks, please contact this office. °  ° ° ° °

## 2018-10-07 ENCOUNTER — Ambulatory Visit: Payer: BLUE CROSS/BLUE SHIELD

## 2019-03-25 ENCOUNTER — Other Ambulatory Visit: Payer: Self-pay | Admitting: Family Medicine

## 2019-03-25 DIAGNOSIS — I10 Essential (primary) hypertension: Secondary | ICD-10-CM

## 2019-03-25 NOTE — Telephone Encounter (Signed)
Requested medication (s) are due for refill today: yes  Requested medication (s) are on the active medication list: yes  Last refill:  01/01/2019  Future visit scheduled: no  Notes to clinic:  Review for refill   Requested Prescriptions  Pending Prescriptions Disp Refills   lisinopril-hydrochlorothiazide (ZESTORETIC) 20-12.5 MG tablet [Pharmacy Med Name: LISINOPRIL-HCTZ 20-12.5 MG TAB] 90 tablet 1    Sig: TAKE 1 TABLET BY MOUTH EVERY DAY     Cardiovascular:  ACEI + Diuretic Combos Failed - 03/25/2019  9:55 AM      Failed - Na in normal range and within 180 days    Sodium  Date Value Ref Range Status  11/08/2017 138 134 - 144 mmol/L Final         Failed - K in normal range and within 180 days    Potassium  Date Value Ref Range Status  11/08/2017 4.4 3.5 - 5.2 mmol/L Final         Failed - Cr in normal range and within 180 days    Creat  Date Value Ref Range Status  02/08/2015 0.79 0.60 - 1.35 mg/dL Final   Creatinine, Ser  Date Value Ref Range Status  11/08/2017 1.01 0.76 - 1.27 mg/dL Final         Failed - Ca in normal range and within 180 days    Calcium  Date Value Ref Range Status  11/08/2017 9.2 8.7 - 10.2 mg/dL Final         Passed - Patient is not pregnant      Passed - Last BP in normal range    BP Readings from Last 1 Encounters:  02/16/18 127/87         Passed - Valid encounter within last 6 months    Recent Outpatient Visits          5 months ago Essential hypertension   Primary Care at [facility], Arlie Solomons, MD   1 year ago Radicular pain of left lower extremity   Primary Care at [place], [place]   1 year ago Essential hypertension   Primary Care at [facility], [place], [place]   1 year ago Acute left-sided low back pain without sciatica   Primary Care at [place], PA-C   1 year ago Pneumonia of left lower lobe due to infectious organism [facility])   Primary Care at [facility], Laurey Arrow, MD

## 2019-06-19 ENCOUNTER — Other Ambulatory Visit: Payer: Self-pay | Admitting: Family Medicine

## 2019-06-19 DIAGNOSIS — I10 Essential (primary) hypertension: Secondary | ICD-10-CM

## 2019-06-19 NOTE — Telephone Encounter (Signed)
Requested medication (s) are due for refill today: yes  Requested medication (s) are on the active medication list: yes  Last refill:  03/27/2019  Future visit scheduled:no  Notes to clinic:  Overdue for office visit    Requested Prescriptions  Pending Prescriptions Disp Refills   lisinopril-hydrochlorothiazide (ZESTORETIC) 20-12.5 MG tablet [Pharmacy Med Name: LISINOPRIL-HCTZ 20-12.5 MG TAB] 90 tablet 0    Sig: TAKE 1 TABLET BY MOUTH EVERY DAY     Cardiovascular:  ACEI + Diuretic Combos Failed - 06/19/2019 12:30 PM      Failed - Na in normal range and within 180 days    Sodium  Date Value Ref Range Status  11/08/2017 138 134 - 144 mmol/L Final         Failed - K in normal range and within 180 days    Potassium  Date Value Ref Range Status  11/08/2017 4.4 3.5 - 5.2 mmol/L Final         Failed - Cr in normal range and within 180 days    Creat  Date Value Ref Range Status  02/08/2015 0.79 0.60 - 1.35 mg/dL Final   Creatinine, Ser  Date Value Ref Range Status  11/08/2017 1.01 0.76 - 1.27 mg/dL Final         Failed - Ca in normal range and within 180 days    Calcium  Date Value Ref Range Status  11/08/2017 9.2 8.7 - 10.2 mg/dL Final         Failed - Valid encounter within last 6 months    Recent Outpatient Visits          8 months ago Essential hypertension   Primary Care at [facility], Arlie Solomons, MD   1 year ago Radicular pain of left lower extremity   Primary Care at [place], [place]   1 year ago Essential hypertension   Primary Care at [place], [place]   1 year ago Acute left-sided low back pain without sciatica   Primary Care at [place], PA-C   1 year ago Pneumonia of left lower lobe due to infectious organism [facility])   Primary Care at [facility], Laurey Arrow, MD             Passed - Patient is not pregnant      Passed - Last BP in normal range    BP Readings from Last 1 Encounters:  02/16/18 127/87

## 2019-12-22 DIAGNOSIS — Z20822 Contact with and (suspected) exposure to covid-19: Secondary | ICD-10-CM | POA: Diagnosis not present

## 2020-05-14 DIAGNOSIS — R0602 Shortness of breath: Secondary | ICD-10-CM | POA: Diagnosis not present

## 2020-05-14 DIAGNOSIS — R5383 Other fatigue: Secondary | ICD-10-CM | POA: Diagnosis not present

## 2020-05-14 DIAGNOSIS — Z1331 Encounter for screening for depression: Secondary | ICD-10-CM | POA: Diagnosis not present

## 2020-05-14 DIAGNOSIS — Z1159 Encounter for screening for other viral diseases: Secondary | ICD-10-CM | POA: Diagnosis not present

## 2020-05-14 DIAGNOSIS — Z125 Encounter for screening for malignant neoplasm of prostate: Secondary | ICD-10-CM | POA: Diagnosis not present

## 2020-05-14 DIAGNOSIS — Z1339 Encounter for screening examination for other mental health and behavioral disorders: Secondary | ICD-10-CM | POA: Diagnosis not present

## 2020-05-14 DIAGNOSIS — E559 Vitamin D deficiency, unspecified: Secondary | ICD-10-CM | POA: Diagnosis not present

## 2020-05-14 DIAGNOSIS — I10 Essential (primary) hypertension: Secondary | ICD-10-CM | POA: Diagnosis not present

## 2020-05-14 DIAGNOSIS — Z0000 Encounter for general adult medical examination without abnormal findings: Secondary | ICD-10-CM | POA: Diagnosis not present

## 2020-05-14 DIAGNOSIS — Z13220 Encounter for screening for lipoid disorders: Secondary | ICD-10-CM | POA: Diagnosis not present

## 2020-05-14 DIAGNOSIS — Z131 Encounter for screening for diabetes mellitus: Secondary | ICD-10-CM | POA: Diagnosis not present

## 2020-05-24 DIAGNOSIS — I10 Essential (primary) hypertension: Secondary | ICD-10-CM | POA: Diagnosis not present

## 2020-05-24 DIAGNOSIS — D751 Secondary polycythemia: Secondary | ICD-10-CM | POA: Diagnosis not present

## 2020-05-24 DIAGNOSIS — E559 Vitamin D deficiency, unspecified: Secondary | ICD-10-CM | POA: Diagnosis not present

## 2020-05-24 DIAGNOSIS — E789 Disorder of lipoprotein metabolism, unspecified: Secondary | ICD-10-CM | POA: Diagnosis not present

## 2020-05-24 DIAGNOSIS — R7303 Prediabetes: Secondary | ICD-10-CM | POA: Diagnosis not present

## 2020-05-25 DIAGNOSIS — Z9229 Personal history of other drug therapy: Secondary | ICD-10-CM | POA: Diagnosis not present

## 2020-05-25 DIAGNOSIS — Z01818 Encounter for other preprocedural examination: Secondary | ICD-10-CM | POA: Diagnosis not present

## 2020-05-25 DIAGNOSIS — Z1211 Encounter for screening for malignant neoplasm of colon: Secondary | ICD-10-CM | POA: Diagnosis not present

## 2020-06-14 DIAGNOSIS — Z01818 Encounter for other preprocedural examination: Secondary | ICD-10-CM | POA: Diagnosis not present

## 2020-06-14 DIAGNOSIS — Z1211 Encounter for screening for malignant neoplasm of colon: Secondary | ICD-10-CM | POA: Diagnosis not present

## 2020-06-24 DIAGNOSIS — K635 Polyp of colon: Secondary | ICD-10-CM | POA: Diagnosis not present

## 2020-06-24 DIAGNOSIS — D369 Benign neoplasm, unspecified site: Secondary | ICD-10-CM | POA: Diagnosis not present

## 2020-07-13 IMAGING — DX DG LUMBAR SPINE COMPLETE 4+V
5 series · 5 of 5 positions shown · non-contrast
Comparison: None.

CLINICAL DATA: persistent left low back/hip pain

EXAM:
LUMBAR SPINE - COMPLETE 4+ VIEW

[l-spine ap]
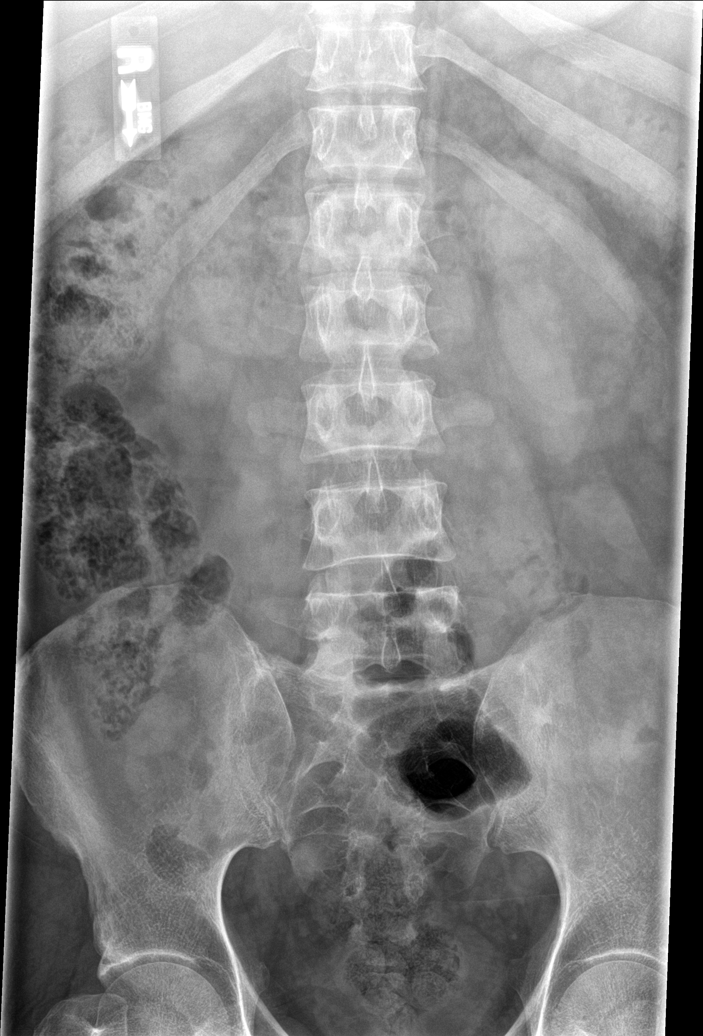

[l-spine obl (1 of 2)]
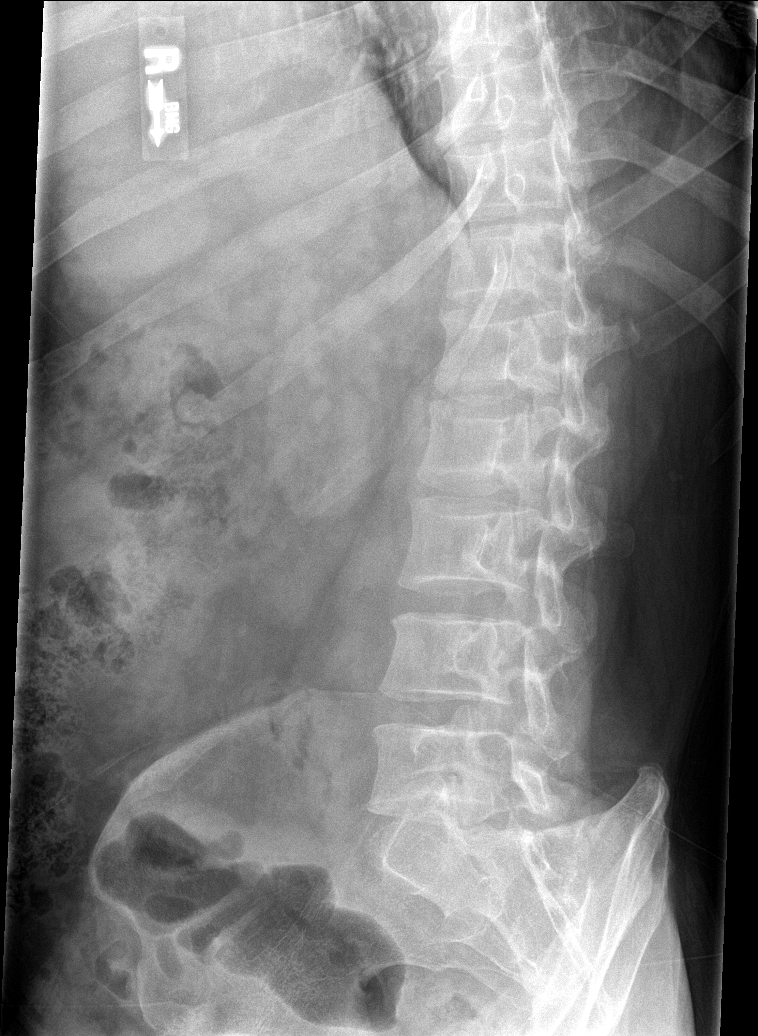

[l-spine obl (2 of 2)]
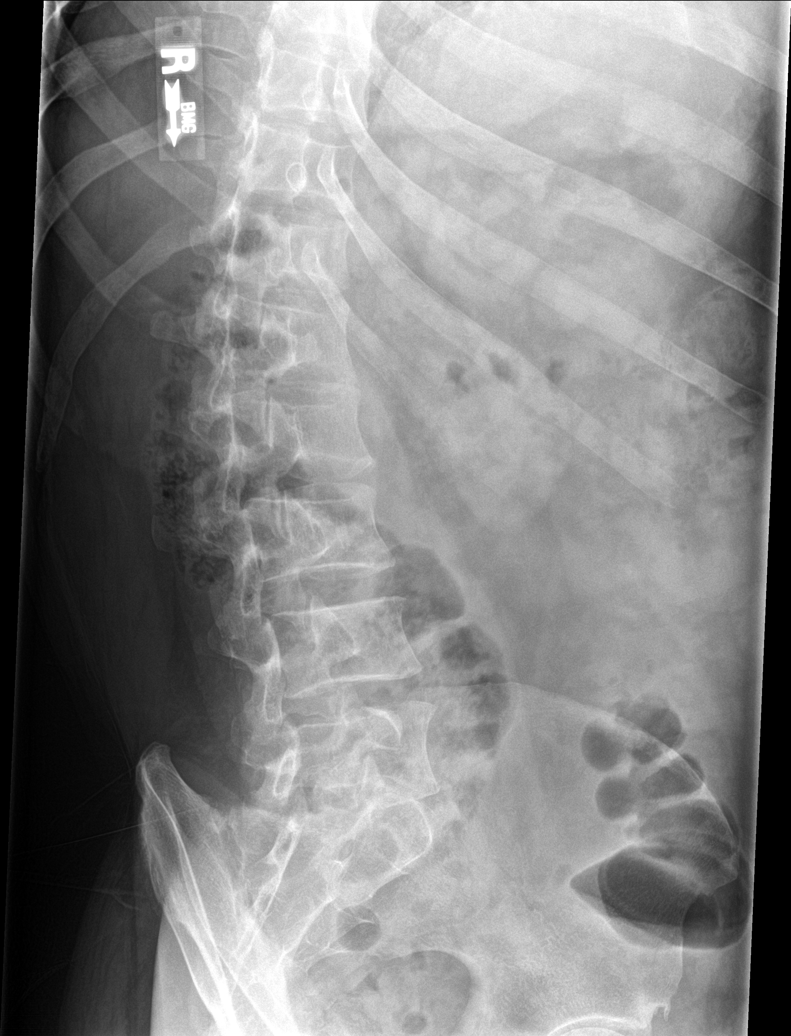

[l-spine lat]
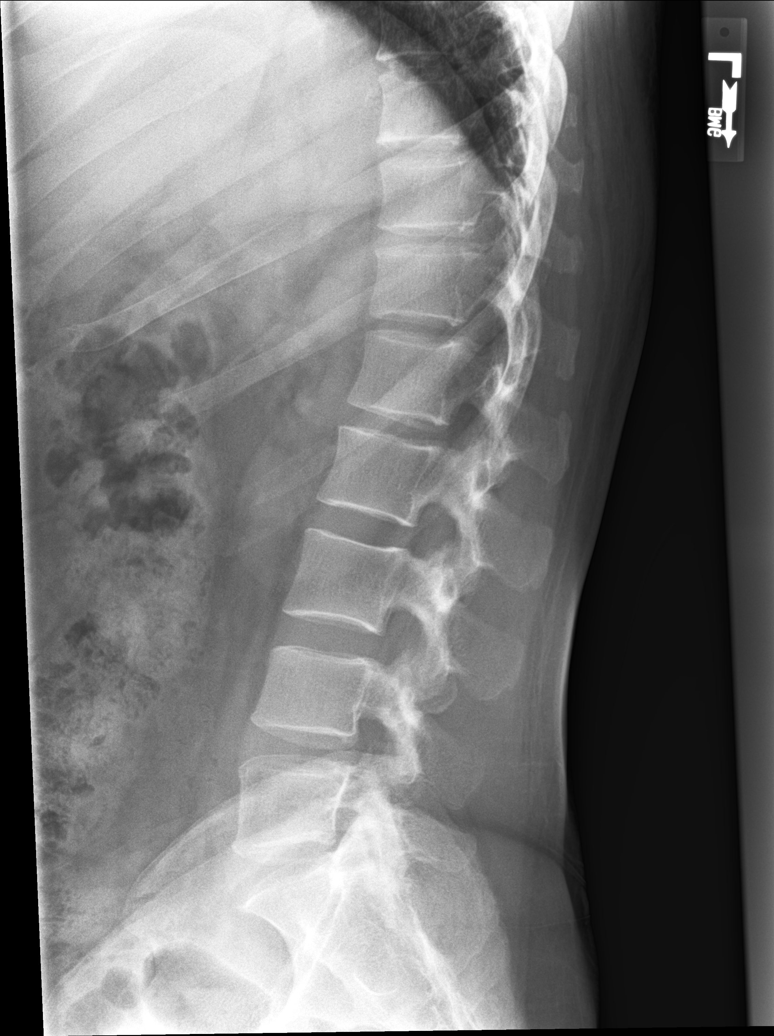

[l-spine l5-s1]
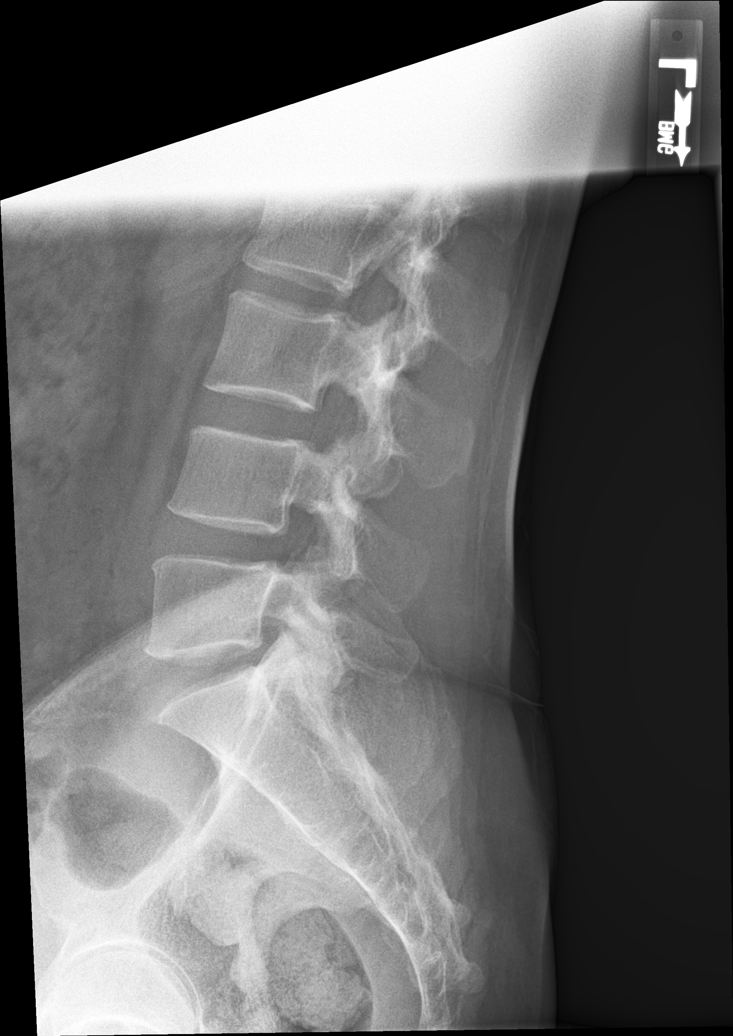

[5 of 5 positions shown; findings below may reference images not displayed]

FINDINGS: There is no evidence of lumbar spine fracture. Alignment is normal.
Intervertebral disc spaces are maintained.
IMPRESSION: No acute findings lumbar spine.

## 2021-07-25 ENCOUNTER — Other Ambulatory Visit: Payer: Self-pay

## 2021-07-25 ENCOUNTER — Encounter (HOSPITAL_COMMUNITY): Payer: Self-pay | Admitting: Emergency Medicine

## 2021-07-25 ENCOUNTER — Emergency Department (HOSPITAL_COMMUNITY)
Admission: EM | Admit: 2021-07-25 | Discharge: 2021-07-26 | Disposition: A | Discharge status: Home / Self Care | Payer: BC Managed Care – PPO | Attending: Emergency Medicine | Admitting: Emergency Medicine

## 2021-07-25 DIAGNOSIS — I10 Essential (primary) hypertension: Secondary | ICD-10-CM | POA: Diagnosis not present

## 2021-07-25 DIAGNOSIS — K625 Hemorrhage of anus and rectum: Secondary | ICD-10-CM

## 2021-07-25 DIAGNOSIS — K4090 Unilateral inguinal hernia, without obstruction or gangrene, not specified as recurrent: Secondary | ICD-10-CM

## 2021-07-25 DIAGNOSIS — Z79899 Other long term (current) drug therapy: Secondary | ICD-10-CM | POA: Diagnosis not present

## 2021-07-25 DIAGNOSIS — K439 Ventral hernia without obstruction or gangrene: Secondary | ICD-10-CM

## 2021-07-25 DIAGNOSIS — R109 Unspecified abdominal pain: Secondary | ICD-10-CM | POA: Diagnosis present

## 2021-07-25 MED ORDER — HYDROCODONE-ACETAMINOPHEN 5-325 MG PO TABS: 1.0000 | ORAL_TABLET | Freq: Once | ORAL | Status: DC

## 2021-07-25 NOTE — ED Provider Triage Note (Signed)
Emergency Medicine Provider Triage Evaluation Note  Eric Pham , a 51 y.o. male  was evaluated in triage.  Pt complains of upper abdominal pain and swelling.  This began around 9:00 tonight.  He states when he lays flat, he can see bulging in this area.  Additionally, he has had 2 bloody bowel movements today.  No urinary symptoms.  No fevers, nausea, vomiting  Review of Systems  Positive: Abd pain, bloody BM Negative: fever  Physical Exam  BP (!) 166/93    Pulse 97    Temp 98.6 F (37 C) (Oral)    Resp 18    SpO2 98%  Gen:   Awake, no distress   Resp:  Normal effort  MSK:   Moves extremities without difficulty  Other:  Mild tenderness palpation of epigastric abdomen.  Diastases rectus.  Medical Decision Making  Medically screening exam initiated at 11:42 PM.  Appropriate orders placed.  Oz Gammel was informed that the remainder of the evaluation will be completed by another provider, this initial triage assessment does not replace that evaluation, and the importance of remaining in the ED until their evaluation is complete.  labs   Alveria Apley, [place] 07/25/21 2343

## 2021-07-25 NOTE — ED Triage Notes (Signed)
Patient reports mid/low abdominal pain with emesis and bloody stool today , he adds lump at umbilical area . Denies fever or chills .

## 2021-07-26 ENCOUNTER — Encounter (HOSPITAL_COMMUNITY): Payer: Self-pay

## 2021-07-26 ENCOUNTER — Emergency Department (HOSPITAL_COMMUNITY): Payer: BC Managed Care – PPO

## 2021-07-26 LAB — CBC WITH DIFFERENTIAL/PLATELET
Abs Immature Granulocytes: 0.04 10*3/uL (ref 0.00–0.07)
Basophils Absolute: 0 10*3/uL (ref 0.0–0.1)
Basophils Relative: 0 %
Eosinophils Absolute: 0.5 10*3/uL (ref 0.0–0.5)
Eosinophils Relative: 6 %
HCT: 47.4 % (ref 39.0–52.0)
Hemoglobin: 15.3 g/dL (ref 13.0–17.0)
Immature Granulocytes: 1 %
Lymphocytes Relative: 29 %
Lymphs Abs: 2.4 10*3/uL (ref 0.7–4.0)
MCH: 26.1 pg (ref 26.0–34.0)
MCHC: 32.3 g/dL (ref 30.0–36.0)
MCV: 80.9 fL (ref 80.0–100.0)
Monocytes Absolute: 0.7 10*3/uL (ref 0.1–1.0)
Monocytes Relative: 8 %
Neutro Abs: 4.7 10*3/uL (ref 1.7–7.7)
Neutrophils Relative %: 56 %
Platelets: 182 10*3/uL (ref 150–400)
RBC: 5.86 MIL/uL — ABNORMAL HIGH (ref 4.22–5.81)
RDW: 13.8 % (ref 11.5–15.5)
WBC: 8.3 10*3/uL (ref 4.0–10.5)
nRBC: 0 % (ref 0.0–0.2)

## 2021-07-26 LAB — COMPREHENSIVE METABOLIC PANEL
ALT: 17 U/L (ref 0–44)
AST: 20 U/L (ref 15–41)
Albumin: 3.7 g/dL (ref 3.5–5.0)
Alkaline Phosphatase: 80 U/L (ref 38–126)
Anion gap: 7 (ref 5–15)
BUN: 18 mg/dL (ref 6–20)
CO2: 24 mmol/L (ref 22–32)
Calcium: 9.1 mg/dL (ref 8.9–10.3)
Chloride: 104 mmol/L (ref 98–111)
Creatinine, Ser: 1.05 mg/dL (ref 0.61–1.24)
GFR, Estimated: >60 mL/min (ref >60)
Glucose, Bld: 180 mg/dL — ABNORMAL HIGH (ref 70–99)
Potassium: 3.9 mmol/L (ref 3.5–5.1)
Sodium: 135 mmol/L (ref 135–145)
Total Bilirubin: 0.5 mg/dL (ref 0.3–1.2)
Total Protein: 6.9 g/dL (ref 6.5–8.1)

## 2021-07-26 LAB — URINALYSIS, ROUTINE W REFLEX MICROSCOPIC
Bilirubin Urine: NEGATIVE
Glucose, UA: NEGATIVE mg/dL
Hgb urine dipstick: NEGATIVE
Ketones, ur: NEGATIVE mg/dL
Leukocytes,Ua: NEGATIVE
Nitrite: NEGATIVE
Protein, ur: NEGATIVE mg/dL
Specific Gravity, Urine: 1.01 (ref 1.005–1.030)
pH: 6 (ref 5.0–8.0)

## 2021-07-26 MED ORDER — SODIUM CHLORIDE 0.9 % IV BOLUS
1000.0000 mL | Freq: Once | INTRAVENOUS | Status: AC
  Administered 2021-07-26: 1000 mL via INTRAVENOUS

## 2021-07-26 MED ORDER — IOHEXOL 300 MG/ML  SOLN
80.0000 mL | Freq: Once | INTRAMUSCULAR | Status: AC | PRN
  Administered 2021-07-26: 80 mL via INTRAVENOUS

## 2021-07-26 NOTE — ED Notes (Signed)
Aware of need for urine specimen.

## 2021-07-26 NOTE — ED Provider Notes (Signed)
[facility] EMERGENCY DEPARTMENT Provider Note   CSN: [id number] Arrival date & time: 07/25/21  2236     History  Chief Complaint  Patient presents with   Abdominal Pain / Bloody Stool     Eric Pham is a 51 y.o. male.  Pt is a 51 yo [place] male with a hx of htn.  He presents to the ED today with some abdominal pain with emesis and a bloody stool.  He said there was a lump in his mid-abdomen as well.  Pt denies f/c.  He has waited for over 9 hrs to be seen and is feeling better now.  No blood per rectum since last night.      Home Medications Prior to Admission medications   Medication Sig Start Date End Date Taking? Authorizing Provider  lisinopril-hydrochlorothiazide (ZESTORETIC) 20-12.5 MG tablet TAKE 1 TABLET BY MOUTH EVERY DAY 06/19/19   Doristine Bosworth, MD      Allergies    Patient has no known allergies.    Review of Systems   Review of Systems  Gastrointestinal:  Positive for abdominal pain, blood in stool and vomiting.  All other systems reviewed and are negative.  Physical Exam Updated Vital Signs BP 118/74    Pulse 66    Temp 98.7 F (37.1 C) (Oral)    Resp 17    SpO2 95%  Physical Exam Vitals and nursing note reviewed.  Constitutional:      Appearance: Normal appearance.  HENT:     Head: Normocephalic and atraumatic.     Right Ear: External ear normal.     Left Ear: External ear normal.     Nose: Nose normal.     Mouth/Throat:     Mouth: Mucous membranes are moist.     Pharynx: Oropharynx is clear.  Eyes:     Extraocular Movements: Extraocular movements intact.     Conjunctiva/sclera: Conjunctivae normal.     Pupils: Pupils are equal, round, and reactive to light.  Cardiovascular:     Rate and Rhythm: Normal rate and regular rhythm.     Pulses: Normal pulses.     Heart sounds: Normal heart sounds.  Pulmonary:     Effort: Pulmonary effort is normal.     Breath sounds: Normal breath sounds.  Abdominal:     General:  Abdomen is flat. Bowel sounds are normal.     Palpations: Abdomen is soft.     Hernia: A hernia is present. Hernia is present in the ventral area.     Comments: Large ventral hernia.  Not obstructed.  Genitourinary:    Comments: No external hemorrhoid noted.  No stool on my finger for fobt, so it was not sent for guaiac. Musculoskeletal:        General: Normal range of motion.     Cervical back: Normal range of motion and neck supple.  Skin:    General: Skin is warm.     Capillary Refill: Capillary refill takes less than 2 seconds.  Neurological:     General: No focal deficit present.     Mental Status: He is alert and oriented to person, place, and time.  Psychiatric:        Mood and Affect: Mood normal.        Behavior: Behavior normal.    ED Results / Procedures / Treatments   Labs (all labs ordered are listed, but only abnormal results are displayed) Labs Reviewed  CBC WITH DIFFERENTIAL/PLATELET - Abnormal; Notable  for the following components:      Result Value   RBC 5.86 (*)    All other components within normal limits  COMPREHENSIVE METABOLIC PANEL - Abnormal; Notable for the following components:   Glucose, Bld 180 (*)    All other components within normal limits  URINALYSIS, ROUTINE W REFLEX MICROSCOPIC    EKG None  Radiology CT ABDOMEN PELVIS W CONTRAST  Result Date: 07/26/2021 CLINICAL DATA:  51 year old male with history of acute onset of nonlocalized abdominal pain, emesis and bloody stool. EXAM: CT ABDOMEN AND PELVIS WITH CONTRAST TECHNIQUE: Multidetector CT imaging of the abdomen and pelvis was performed using the standard protocol following bolus administration of intravenous contrast. RADIATION DOSE REDUCTION: This exam was performed according to the departmental dose-optimization program which includes automated exposure control, adjustment of the mA and/or kV according to patient size and/or use of iterative reconstruction technique. CONTRAST:  80mL  OMNIPAQUE IOHEXOL 300 MG/ML  SOLN COMPARISON:  No priors. FINDINGS: Lower chest: Unremarkable. Hepatobiliary: No suspicious cystic or solid hepatic lesions. No intra or extrahepatic biliary ductal dilatation. Gallbladder is normal in appearance. Pancreas: No pancreatic mass. No pancreatic ductal dilatation. No pancreatic or peripancreatic fluid collections or inflammatory changes. Spleen: Unremarkable. Adrenals/Urinary Tract: Bilateral kidneys and bilateral adrenal glands are normal in appearance. No hydroureteronephrosis. Urinary bladder is normal in appearance. Stomach/Bowel: The appearance of the stomach is normal. No pathologic dilatation of small bowel or colon. Several scattered colonic diverticulae are noted, without surrounding inflammatory changes to suggest an acute diverticulitis at this time. Normal appendix. Vascular/Lymphatic: No significant atherosclerotic disease, aneurysm or dissection identified in the visualized abdominal vasculature. No lymphadenopathy noted in the abdomen or pelvis. Reproductive: Prostate gland and seminal vesicles are unremarkable in appearance. Other: Right inguinal hernia containing only fat. No significant volume of ascites. No pneumoperitoneum. Musculoskeletal: There are no aggressive appearing lytic or blastic lesions noted in the visualized portions of the skeleton. IMPRESSION: 1. No acute findings are noted in the abdomen or pelvis to account for the patient's symptoms. 2. Large right inguinal hernia containing only fat. 3. Colonic diverticulosis without evidence of acute diverticulitis at this time. Electronically Signed   By: Trudie Reedaniel  Entrikin M.D.   On: 07/26/2021 08:58    Procedures Procedures    Medications Ordered in ED Medications  sodium chloride 0.9 % bolus 1,000 mL (0 mLs Intravenous Stopped 07/26/21 0918)  iohexol (OMNIPAQUE) 300 MG/ML solution 80 mL (80 mLs Intravenous Contrast Given 07/26/21 0853)    ED Course/ Medical Decision Making/ A&P                            Medical Decision Making  Labs reviewed.  His cbc was nl.  His cmp shows a slight elevation in the glucose of 180.    CT reviewed.  He has a large right inguinal hernia containing only fat.  He has no acute findings.  Pt continues to feel well.  Pt is told to f/u with surgery for his inguinal hernia and his ventral hernia.  As for the rectal bleeding, he has a hx of hemorrhoids and may have an internal hemorrhoid.  He is put on anusol.  He is to f/u with gi.  Pt knows to return if sx worsen.        Final Clinical Impression(s) / ED Diagnoses Final diagnoses:  Non-recurrent unilateral inguinal hernia without obstruction or gangrene  Ventral hernia without obstruction or gangrene  Rectal bleeding  Rx / DC Orders ED Discharge Orders          Ordered    Ambulatory referral to Gastroenterology        07/26/21 0954              Jacalyn Lefevre, MD 07/28/21 [phone]

## 2023-12-21 IMAGING — CT CT ABD-PELV W/ CM
2 of 5 series · 15 of 46 positions shown, 17 images · IV contrast (Omni 300)
Comparison: No priors.

CLINICAL DATA: 50-year-old male with history of acute onset of
nonlocalized abdominal pain, emesis and bloody stool.

EXAM:
CT ABDOMEN AND PELVIS WITH CONTRAST
TECHNIQUE: Multidetector CT imaging of the abdomen and pelvis was performed
using the standard protocol following bolus administration of
intravenous contrast.

[Series 3: a/p w/ 5mm · axial · 0.83mm/px · z∈[+853,+1328]mm · 12 of 107 slices shown, 14 images]
[im 6/107  soft-tissue]
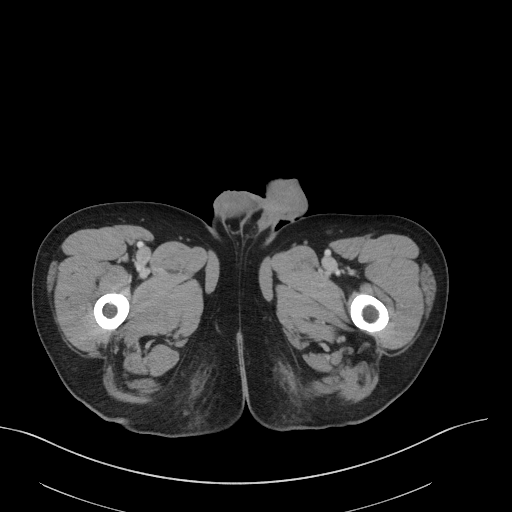
[im 6/107  bone]
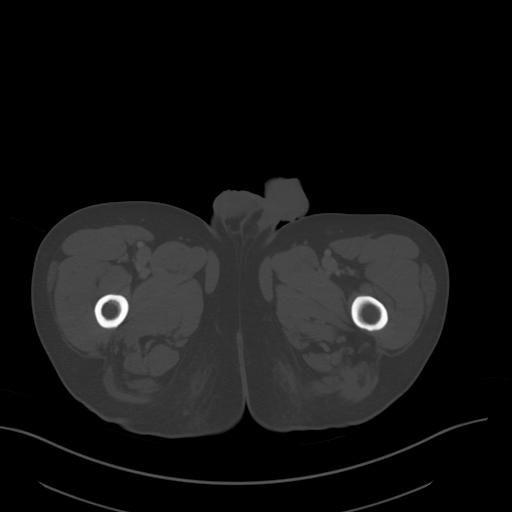
[im 17/107  soft-tissue]
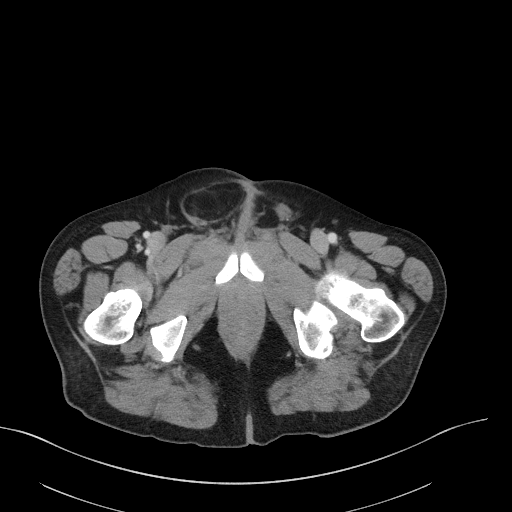
[im 23/107  soft-tissue]
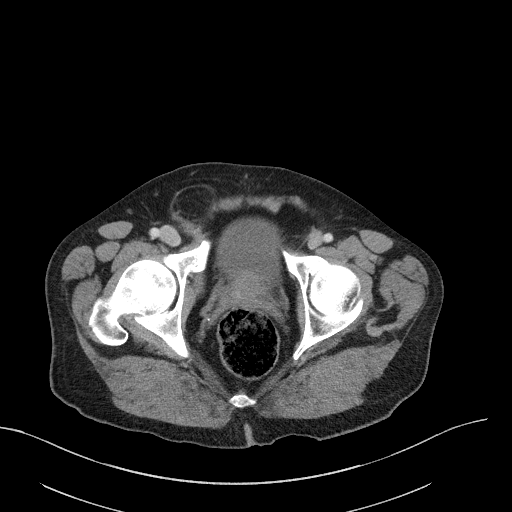
[im 34/107  soft-tissue]
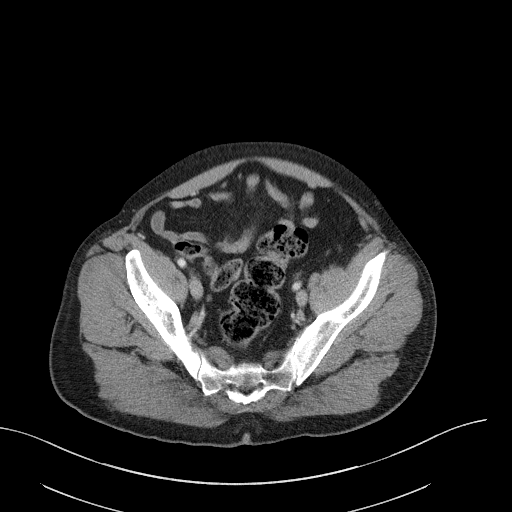
[im 40/107  soft-tissue]
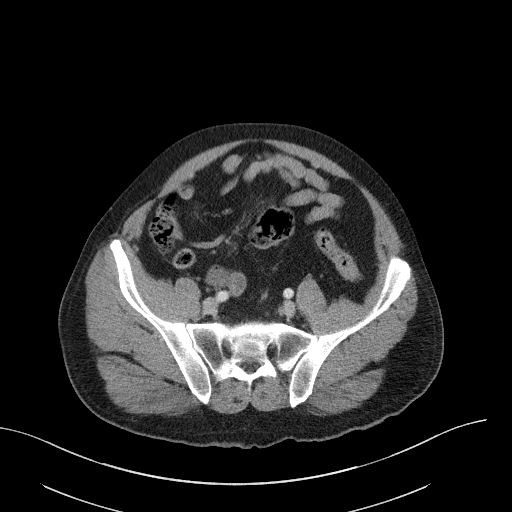
[im 51/107  soft-tissue]
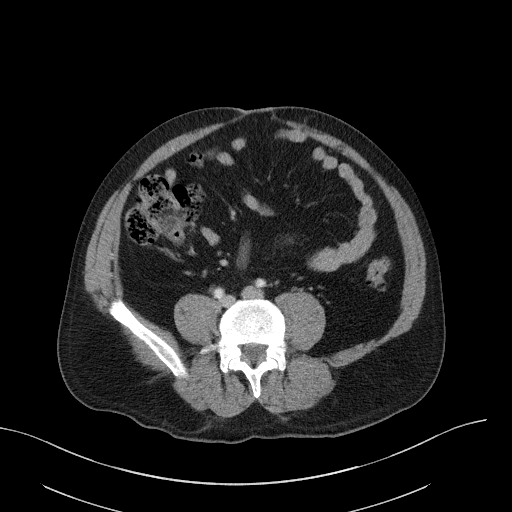
[im 56/107  soft-tissue]
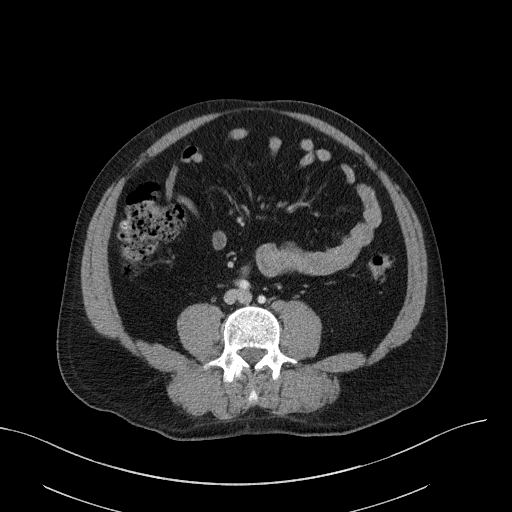
[im 67/107  soft-tissue]
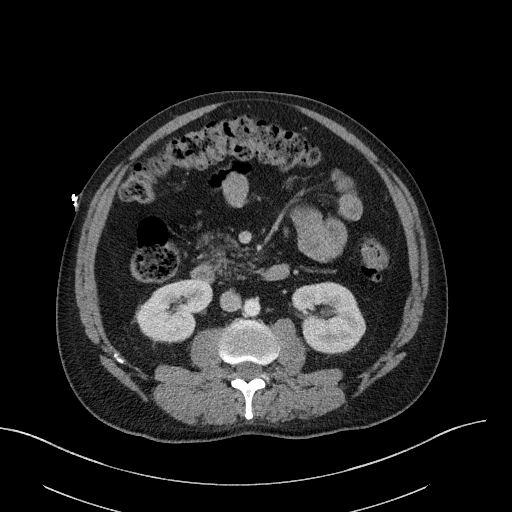
[im 73/107  soft-tissue]
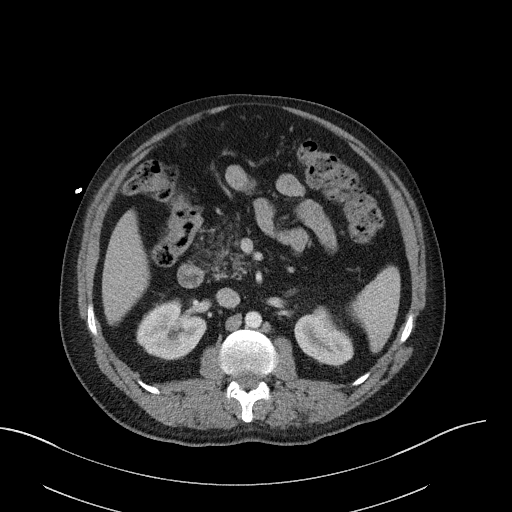
[im 73/107  bone]
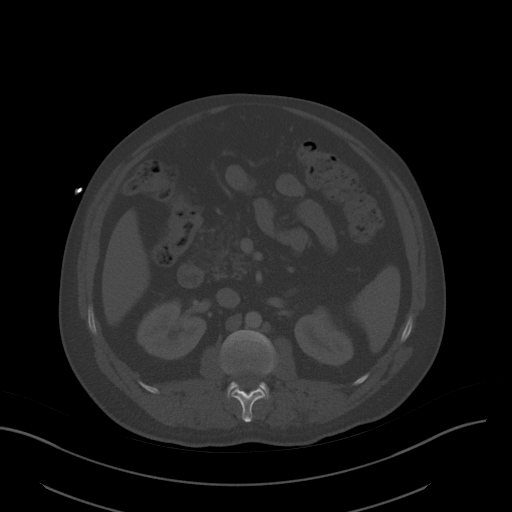
[im 84/107  soft-tissue]
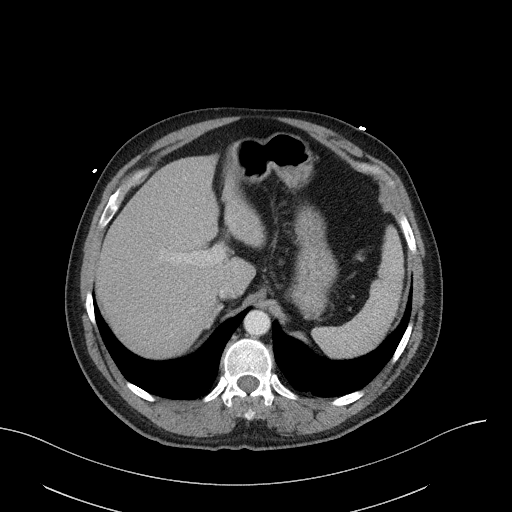
[im 90/107  soft-tissue]
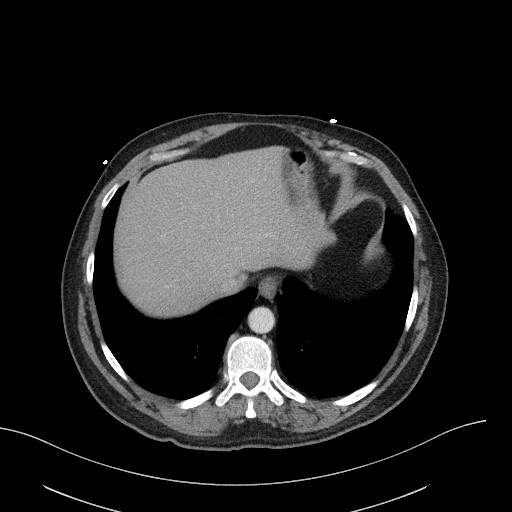
[im 101/107  soft-tissue]
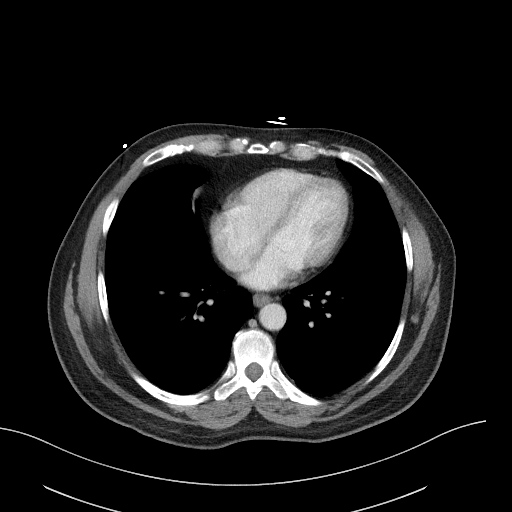

[Series 6: a/p w/ cor · coronal · 0.70mm/px · 3 of 151 slices shown]
[im 51/151  soft-tissue]
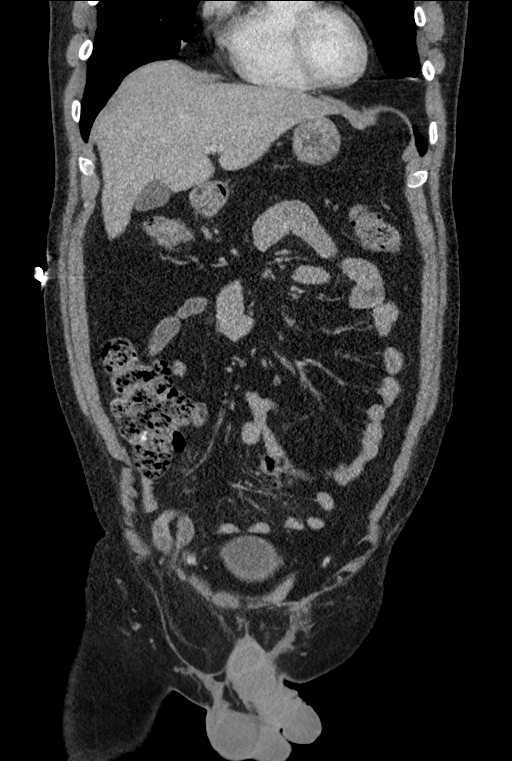
[im 67/151  soft-tissue]
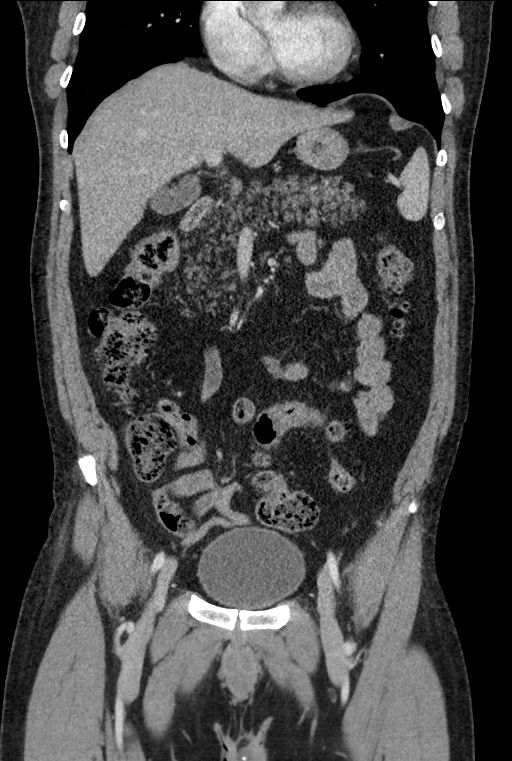
[im 84/151  soft-tissue]
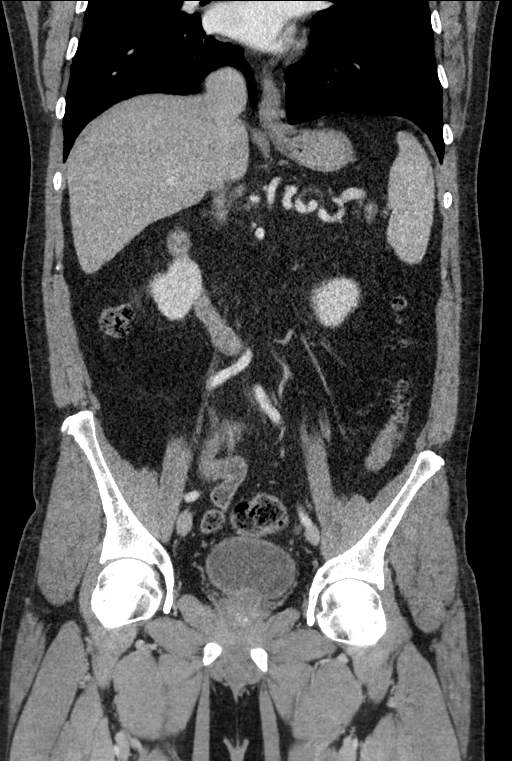

[15 of 46 positions shown; findings below may reference images not displayed]

RADIATION DOSE REDUCTION: This exam was performed according to the
departmental dose-optimization program which includes automated
exposure control, adjustment of the mA and/or kV according to
patient size and/or use of iterative reconstruction technique.

CONTRAST:  80mL OMNIPAQUE IOHEXOL 300 MG/ML  SOLN
FINDINGS: Lower chest: Unremarkable.

Hepatobiliary: No suspicious cystic or solid hepatic lesions. No
intra or extrahepatic biliary ductal dilatation. Gallbladder is
normal in appearance.

Pancreas: No pancreatic mass. No pancreatic ductal dilatation. No
pancreatic or peripancreatic fluid collections or inflammatory
changes.

Spleen: Unremarkable.

Adrenals/Urinary Tract: Bilateral kidneys and bilateral adrenal
glands are normal in appearance. No hydroureteronephrosis. Urinary
bladder is normal in appearance.

Stomach/Bowel: The appearance of the stomach is normal. No
pathologic dilatation of small bowel or colon. Several scattered
colonic diverticulae are noted, without surrounding inflammatory
changes to suggest an acute diverticulitis at this time. Normal
appendix.

Vascular/Lymphatic: No significant atherosclerotic disease, aneurysm
or dissection identified in the visualized abdominal vasculature. No
lymphadenopathy noted in the abdomen or pelvis.

Reproductive: Prostate gland and seminal vesicles are unremarkable
in appearance.

Other: Right inguinal hernia containing only fat. No significant
volume of ascites. No pneumoperitoneum.

Musculoskeletal: There are no aggressive appearing lytic or blastic
lesions noted in the visualized portions of the skeleton.
IMPRESSION: 1. No acute findings are noted in the abdomen or pelvis to account
for the patient's symptoms.
2. Large right inguinal hernia containing only fat.
3. Colonic diverticulosis without evidence of acute diverticulitis
at this time.

## 2024-02-16 ENCOUNTER — Emergency Department (HOSPITAL_BASED_OUTPATIENT_CLINIC_OR_DEPARTMENT_OTHER)
Admission: EM | Admit: 2024-02-16 | Discharge: 2024-02-16 | Disposition: A | Discharge status: Home / Self Care | Attending: Emergency Medicine | Admitting: Emergency Medicine

## 2024-02-16 ENCOUNTER — Other Ambulatory Visit: Payer: Self-pay

## 2024-02-16 ENCOUNTER — Emergency Department (HOSPITAL_BASED_OUTPATIENT_CLINIC_OR_DEPARTMENT_OTHER)

## 2024-02-16 ENCOUNTER — Emergency Department (HOSPITAL_BASED_OUTPATIENT_CLINIC_OR_DEPARTMENT_OTHER): Admitting: Radiology

## 2024-02-16 ENCOUNTER — Encounter (HOSPITAL_BASED_OUTPATIENT_CLINIC_OR_DEPARTMENT_OTHER): Payer: Self-pay | Admitting: Emergency Medicine

## 2024-02-16 DIAGNOSIS — I10 Essential (primary) hypertension: Secondary | ICD-10-CM | POA: Insufficient documentation

## 2024-02-16 DIAGNOSIS — H8110 Benign paroxysmal vertigo, unspecified ear: Secondary | ICD-10-CM | POA: Diagnosis not present

## 2024-02-16 DIAGNOSIS — J0100 Acute maxillary sinusitis, unspecified: Secondary | ICD-10-CM

## 2024-02-16 DIAGNOSIS — R42 Dizziness and giddiness: Secondary | ICD-10-CM | POA: Diagnosis present

## 2024-02-16 LAB — CBC
HCT: 43.8 % (ref 39.0–52.0)
Hemoglobin: 14.8 g/dL (ref 13.0–17.0)
MCH: 26.4 pg (ref 26.0–34.0)
MCHC: 33.8 g/dL (ref 30.0–36.0)
MCV: 78.1 fL — ABNORMAL LOW (ref 80.0–100.0)
Platelets: 170 K/uL (ref 150–400)
RBC: 5.61 MIL/uL (ref 4.22–5.81)
RDW: 14.5 % (ref 11.5–15.5)
WBC: 7.1 K/uL (ref 4.0–10.5)
nRBC: 0 % (ref 0.0–0.2)

## 2024-02-16 LAB — COMPREHENSIVE METABOLIC PANEL WITH GFR
ALT: 16 U/L (ref 0–44)
AST: 27 U/L (ref 15–41)
Albumin: 4 g/dL (ref 3.5–5.0)
Alkaline Phosphatase: 104 U/L (ref 38–126)
Anion gap: 13 (ref 5–15)
BUN: 23 mg/dL — ABNORMAL HIGH (ref 6–20)
CO2: 21 mmol/L — ABNORMAL LOW (ref 22–32)
Calcium: 9.2 mg/dL (ref 8.9–10.3)
Chloride: 99 mmol/L (ref 98–111)
Creatinine, Ser: 1.07 mg/dL (ref 0.61–1.24)
GFR, Estimated: >60 mL/min (ref >60)
Glucose, Bld: 94 mg/dL (ref 70–99)
Potassium: 3.9 mmol/L (ref 3.5–5.1)
Sodium: 134 mmol/L — ABNORMAL LOW (ref 135–145)
Total Bilirubin: 0.5 mg/dL (ref 0.0–1.2)
Total Protein: 7.4 g/dL (ref 6.5–8.1)

## 2024-02-16 LAB — URINALYSIS, ROUTINE W REFLEX MICROSCOPIC
Bacteria, UA: NONE SEEN
Bilirubin Urine: NEGATIVE
Glucose, UA: NEGATIVE mg/dL
Hgb urine dipstick: NEGATIVE
Ketones, ur: NEGATIVE mg/dL
Leukocytes,Ua: NEGATIVE
Nitrite: NEGATIVE
Protein, ur: NEGATIVE mg/dL
Specific Gravity, Urine: 1.009 (ref 1.005–1.030)
pH: 5 (ref 5.0–8.0)

## 2024-02-16 MED ORDER — MECLIZINE HCL 25 MG PO TABS
25.0000 mg | ORAL_TABLET | Freq: Once | ORAL | Status: AC
  Administered 2024-02-16: 25 mg via ORAL
  Filled 2024-02-16: qty 1

## 2024-02-16 MED ORDER — SODIUM CHLORIDE 0.9 % IV BOLUS
1000.0000 mL | Freq: Once | INTRAVENOUS | Status: AC
  Administered 2024-02-16: 1000 mL via INTRAVENOUS

## 2024-02-16 MED ORDER — MECLIZINE HCL 25 MG PO TABS: 25.0000 mg | ORAL_TABLET | Freq: Three times a day (TID) | ORAL | 0 refills | Status: AC | PRN

## 2024-02-16 MED ORDER — AMOXICILLIN-POT CLAVULANATE 875-125 MG PO TABS: 1.0000 | ORAL_TABLET | Freq: Two times a day (BID) | ORAL | 0 refills | Status: DC

## 2024-02-16 NOTE — ED Triage Notes (Signed)
 Dizziness, nausea Started at 4:30 om at work Worse when standing hA

## 2024-02-16 NOTE — ED Provider Notes (Addendum)
 [place] EMERGENCY DEPARTMENT AT [facility] Provider Note   CSN: [id number] Arrival date & time: 02/16/24  1749     Patient presents with: Dizziness   Eric Pham is a 53 y.o. male.   Patient had an upper respiratory infection with cough for the past few days.  Is been taking Delsym.  At work today at around 1630 he suddenly got some dizziness with some room spinning.  No nausea no vomiting.  If he moves his head left or right it brings it back on.  Is able to walk but does feel as if he could possibly fall down.  Never anything like this before.  Arms and legs without any weakness no numbness no visual changes.  Does have a little bit of posterior headache.  No neck pain.  No fevers.  Past medical history significant for hypertension epistaxis and history of allergies hernia repair on the left side inguinal on 2009.  Smokes tobacco some days.  No nausea or vomiting.  No chest pain no shortness of breath no abdominal pain.       Prior to Admission medications   Medication Sig Start Date End Date Taking? Authorizing Provider  lisinopril -hydrochlorothiazide  (ZESTORETIC ) 20-12.5 MG tablet TAKE 1 TABLET BY MOUTH EVERY DAY 06/19/19   Stallings, Zoe A, MD    Allergies: Patient has no known allergies.    Review of Systems  Constitutional:  Negative for chills and fever.  HENT:  Positive for congestion. Negative for ear pain and sore throat.   Eyes:  Negative for pain and visual disturbance.  Respiratory:  Positive for cough. Negative for shortness of breath.   Cardiovascular:  Negative for chest pain and palpitations.  Gastrointestinal:  Negative for abdominal pain and vomiting.  Genitourinary:  Negative for dysuria and hematuria.  Musculoskeletal:  Negative for arthralgias and back pain.  Skin:  Negative for color change and rash.  Neurological:  Positive for dizziness and headaches. Negative for seizures and syncope.  All other systems reviewed and are  negative.   Updated Vital Signs BP 134/73   Pulse 66   Temp 98.2 F (36.8 C) (Oral)   Resp 15   SpO2 100%   Physical Exam Vitals and nursing note reviewed.  Constitutional:      General: He is not in acute distress.    Appearance: Normal appearance. He is well-developed. He is not ill-appearing.  HENT:     Head: Normocephalic and atraumatic.     Mouth/Throat:     Mouth: Mucous membranes are moist.  Eyes:     Extraocular Movements: Extraocular movements intact.     Conjunctiva/sclera: Conjunctivae normal.     Pupils: Pupils are equal, round, and reactive to light.  Cardiovascular:     Rate and Rhythm: Normal rate and regular rhythm.     Heart sounds: No murmur heard. Pulmonary:     Effort: Pulmonary effort is normal. No respiratory distress.     Breath sounds: Normal breath sounds. No wheezing, rhonchi or rales.  Abdominal:     Palpations: Abdomen is soft.     Tenderness: There is no abdominal tenderness.  Musculoskeletal:        General: No swelling.     Cervical back: Neck supple.     Right lower leg: No edema.     Left lower leg: No edema.  Skin:    General: Skin is warm and dry.     Capillary Refill: Capillary refill takes less than 2 seconds.  Neurological:  General: No focal deficit present.     Mental Status: He is alert and oriented to person, place, and time.     Cranial Nerves: No cranial nerve deficit.     Sensory: No sensory deficit.     Motor: No weakness.     Coordination: Coordination normal.  Psychiatric:        Mood and Affect: Mood normal.     (all labs ordered are listed, but only abnormal results are displayed) Labs Reviewed  COMPREHENSIVE METABOLIC PANEL WITH GFR - Abnormal; Notable for the following components:      Result Value   Sodium 134 (*)    CO2 21 (*)    BUN 23 (*)    All other components within normal limits  CBC - Abnormal; Notable for the following components:   MCV 78.1 (*)    All other components within normal limits   URINALYSIS, ROUTINE W REFLEX MICROSCOPIC - Abnormal; Notable for the following components:   Color, Urine COLORLESS (*)    All other components within normal limits  CBG MONITORING, ED    EKG: EKG Interpretation Date/Time:  Wednesday February 16 2024 17:58:12 EDT Ventricular Rate:  78 PR Interval:  130 QRS Duration:  89 QT Interval:  364 QTC Calculation: 415 R Axis:   38  Text Interpretation: Sinus rhythm Abnormal R-wave progression, early transition Baseline wander in lead(s) V4 No previous ECGs available Confirmed by Kritika Stukes [id number] on 02/16/2024 6:03:15 PM  Radiology: [facility] Chest 2 View Result Date: 02/16/2024 CLINICAL DATA:  Cough, dizziness, nausea EXAM: CHEST - 2 VIEW COMPARISON:  11/08/2017 FINDINGS: The heart size and mediastinal contours are within normal limits. Both lungs are clear. The visualized skeletal structures are unremarkable. IMPRESSION: No active cardiopulmonary disease. Electronically Signed   By: Ozell Daring M.D.   On: 02/16/2024 19:15   CT Head Wo Contrast Result Date: 02/16/2024 CLINICAL DATA:  Dizziness, nausea, headache EXAM: CT HEAD WITHOUT CONTRAST TECHNIQUE: Contiguous axial images were obtained from the base of the skull through the vertex without intravenous contrast. RADIATION DOSE REDUCTION: This exam was performed according to the departmental dose-optimization program which includes automated exposure control, adjustment of the mA and/or kV according to patient size and/or use of iterative reconstruction technique. COMPARISON:  None Available. FINDINGS: Brain: No acute infarct or hemorrhage. Lateral ventricles and midline structures are unremarkable. No acute extra-axial fluid collections. No mass effect. Vascular: No hyperdense vessel or unexpected calcification. Skull: Normal. Negative for fracture or focal lesion. Sinuses/Orbits: Small gas fluid level within the left maxillary sinus consistent with acute sinusitis. Remaining paranasal sinuses are  clear. Other: None. IMPRESSION: 1. Gas fluid level left maxillary sinus consistent with acute sinusitis. 2. No acute intracranial process. Electronically Signed   By: Ozell Daring M.D.   On: 02/16/2024 19:14     Procedures   Medications Ordered in the ED  meclizine  (ANTIVERT ) tablet 25 mg (25 mg Oral Given 02/16/24 1859)  sodium chloride  0.9 % bolus 1,000 mL (1,000 mLs Intravenous New Bag/Given 02/16/24 1901)                                    Medical Decision Making Amount and/or Complexity of Data Reviewed Labs: ordered. Radiology: ordered.  Risk Prescription drug management.   The vertigo is reproducible with moving head left to right.  Suggestive of benign positional vertigo.  Will get head CT since there is a  headache.  Patient also recently still has an upper respiratory infection taking Delsym for the persistent cough.  This could be viral etiology.  EKG without any acute.  Will get labs head CT give a liter of fluid and will get a chest x-ray since there is been a cough.  Will also give a dose of Antivert .  CT head shows acute left maxillary sinusitis.  Otherwise negative.  Could be playing a role with the vertigo.  Chest x-ray no acute findings.  Complete metabolic panel no LFT abnormalities no significant electrolyte abnormalities renal function normal.  Urinalysis negative.  CBC no leukocytosis hemoglobin 14.8 platelets 170.  If patient is doing well on the Antivert  we will continue that.  Will probably go ahead and treat the acute sinusitis as well and then have him follow-up as needed.  Clinically do not think that this is consistent with a CVA.  I think this is benign positional vertigo and the sinusitis may be playing a role.  Final diagnoses:  Benign paroxysmal positional vertigo, unspecified laterality  Acute non-recurrent maxillary sinusitis    ED Discharge Orders     None          Geraldene Hamilton, MD 02/16/24 CORINN    Geraldene Hamilton,  MD 02/16/24 8046    Geraldene Hamilton, MD 02/16/24 2003

## 2024-02-16 NOTE — Discharge Instructions (Signed)
 Take the antibiotic Augmentin  as directed for the sinus infection.  Take the Antivert  as needed for the dizziness.  Make an appointment to follow-up with your primary care doctor to be rechecked.  Work note to be out of work for the next 2 days provided.  Return for any new or worse symptoms.  If the dizziness persists you may require an MRI.

## 2024-06-18 ENCOUNTER — Encounter (HOSPITAL_BASED_OUTPATIENT_CLINIC_OR_DEPARTMENT_OTHER): Payer: Self-pay | Admitting: Emergency Medicine

## 2024-06-18 ENCOUNTER — Other Ambulatory Visit: Payer: Self-pay

## 2024-06-18 ENCOUNTER — Emergency Department (HOSPITAL_BASED_OUTPATIENT_CLINIC_OR_DEPARTMENT_OTHER): Admitting: Radiology

## 2024-06-18 ENCOUNTER — Emergency Department (HOSPITAL_BASED_OUTPATIENT_CLINIC_OR_DEPARTMENT_OTHER): Admission: EM | Admit: 2024-06-18 | Discharge: 2024-06-18 | Disposition: A | Discharge status: Home / Self Care

## 2024-06-18 DIAGNOSIS — M79671 Pain in right foot: Secondary | ICD-10-CM | POA: Insufficient documentation

## 2024-06-18 DIAGNOSIS — I10 Essential (primary) hypertension: Secondary | ICD-10-CM | POA: Insufficient documentation

## 2024-06-18 DIAGNOSIS — Z79899 Other long term (current) drug therapy: Secondary | ICD-10-CM | POA: Insufficient documentation

## 2024-06-18 MED ORDER — INDOMETHACIN 50 MG PO CAPS: 50.0000 mg | ORAL_CAPSULE | Freq: Two times a day (BID) | ORAL | 0 refills | Status: AC

## 2024-06-18 MED ORDER — LISINOPRIL 30 MG PO TABS: 30.0000 mg | ORAL_TABLET | Freq: Every day | ORAL | 0 refills | Status: AC

## 2024-06-18 MED ORDER — CEPHALEXIN 500 MG PO CAPS: 500.0000 mg | ORAL_CAPSULE | Freq: Four times a day (QID) | ORAL | 0 refills | Status: AC

## 2024-06-18 MED ORDER — KETOROLAC TROMETHAMINE 15 MG/ML IJ SOLN
15.0000 mg | Freq: Once | INTRAMUSCULAR | Status: AC
  Administered 2024-06-18: 15 mg via INTRAMUSCULAR
  Filled 2024-06-18: qty 1

## 2024-06-18 MED ORDER — CEPHALEXIN 250 MG PO CAPS
500.0000 mg | ORAL_CAPSULE | Freq: Once | ORAL | Status: AC
  Administered 2024-06-18: 500 mg via ORAL
  Filled 2024-06-18: qty 2

## 2024-06-18 NOTE — ED Triage Notes (Addendum)
 Pt declines language interpretation. Pt via POV c/o right foot pain since last week, worse since waking this morning. Pain rated 8/10 and pt is walking with a limp. No known injury; denies prior hx gout.

## 2024-06-18 NOTE — Discharge Instructions (Signed)
 I think your symptoms may be due to gout.  Please stop your blood pressure medicine and start the higher dose lisinopril  instead starting tomorrow.  Take the antibiotic in the event that this is due to a skin infection.  Take the indomethacin  which is a pain/anti-inflammatory medication and follow-up with your doctor.  Return to the ER for worsening symptoms.

## 2024-06-18 NOTE — ED Provider Notes (Signed)
 [place] EMERGENCY DEPARTMENT AT [facility] Provider Note   CSN: [id number] Arrival date & time: 06/18/24  2040     Patient presents with: Foot Pain   Eric Pham is a 53 y.o. male.   53 year old male with past medical history of hypertension presenting to the emergency department today with pain in his right foot.  The patient states that this been going now over the past few days.  He states that he did have some pain over the back of his foot but is now having some pain over the forefoot.  Denies any significant ankle pain.  The patient has been able to walk but has been limping due to the pain.  Denies any associated fevers.  He denies any history of gout.  He came to the ER for further evaluation regarding this due to these ongoing symptoms.  He denies any history of IV drug abuse.   Foot Pain       Prior to Admission medications   Medication Sig Start Date End Date Taking? Authorizing Provider  cephALEXin  (KEFLEX ) 500 MG capsule Take 1 capsule (500 mg total) by mouth 4 (four) times daily. 06/18/24  Yes Ula Prentice SAUNDERS, MD  indomethacin  (INDOCIN ) 50 MG capsule Take 1 capsule (50 mg total) by mouth 2 (two) times daily with a meal. 06/18/24  Yes Ula Prentice SAUNDERS, MD  lisinopril  (ZESTRIL ) 30 MG tablet Take 1 tablet (30 mg total) by mouth daily. 06/18/24  Yes Ula Prentice SAUNDERS, MD  meclizine  (ANTIVERT ) 25 MG tablet Take 1 tablet (25 mg total) by mouth 3 (three) times daily as needed for dizziness. 02/16/24   Zackowski, Scott, MD    Allergies: Patient has no known allergies.    Review of Systems  Musculoskeletal:  Positive for arthralgias.  All other systems reviewed and are negative.   Updated Vital Signs BP (!) 135/121   Pulse 99   Temp 98.1 F (36.7 C)   Resp 16   Ht 5' 5 (1.651 m)   Wt 79.4 kg   SpO2 97%   BMI 29.12 kg/m   Physical Exam Vitals and nursing note reviewed.   Gen: NAD Eyes: PERRL, EOMI HEENT: no oropharyngeal swelling Neck: trachea  midline Resp: clear to auscultation bilaterally Card: RRR, no murmurs, rubs, or gallops Abd: nontender, nondistended Extremities: no calf tenderness, no edema, the patient has some tenderness over the proximal metacarpals on the right with mild swelling noted, there is no overlying erythema, the patient has normal range of motion of the ankle both actively and passively with no significant pain with movement. Vascular: 2+ radial pulses bilaterally, 2+ DP pulses bilaterally Skin: no rashes Psyc: acting appropriately   (all labs ordered are listed, but only abnormal results are displayed) Labs Reviewed - No data to display  EKG: None  Radiology: DG Foot Complete Right Result Date: 06/18/2024 EXAM: 3 OR MORE VIEW(S) XRAY OF THE FOOT 06/18/2024 09:25:00 PM COMPARISON: None available. CLINICAL HISTORY: Pain FINDINGS: BONES AND JOINTS: No acute fracture. No malalignment. Superior and inferior calcaneal spurs. Mild midfoot osteoarthritis. SOFT TISSUES: Mild soft tissue swelling of the midfoot. IMPRESSION: 1. Mild soft tissue swelling of the dorsal  midfoot. 2. Mild midfoot osteoarthritis. 3. Superior and inferior calcaneal spurs. Electronically signed by: Dorethia Molt MD 06/18/2024 09:43 PM EST RP Workstation: [id number]     Procedures   Medications Ordered in the ED  cephALEXin  (KEFLEX ) capsule 500 mg (500 mg Oral Given 06/18/24 2238)  ketorolac  (TORADOL ) 15 MG/ML injection 15  mg (15 mg Intramuscular Given 06/18/24 2238)                                    Medical Decision Making 53 year old male with past medical history of hypertension presenting to the emergency department today with pain in his foot.  The patient does not have any findings on exam consistent with septic arthritis.  He does have some mild swelling of the forefoot.  Question cellulitis.  I will obtain x-ray here to eval for acute bony abnormalities.  Will give patient Toradol  for pain after reviewing his most recent renal  function.  I will reevaluate for ultimate disposition.  This also may be due to gout.  The patient's x-ray does show some arthritis as well as the soft tissue swelling.  I will start the patient on Keflex .  Will send him home on indomethacin  for possible gout.  He will be discharged with return precautions.  Amount and/or Complexity of Data Reviewed Radiology: ordered.  Risk Prescription drug management.        Final diagnoses:  Right foot pain    ED Discharge Orders          Ordered    cephALEXin  (KEFLEX ) 500 MG capsule  4 times daily        06/18/24 2241    indomethacin  (INDOCIN ) 50 MG capsule  2 times daily with meals        06/18/24 2241    lisinopril  (ZESTRIL ) 30 MG tablet  Daily        06/18/24 2241               Ula Prentice SAUNDERS, MD 06/18/24 2242
